# Patient Record
Sex: Male | Born: 1975 | Race: Black or African American | Hispanic: No | Marital: Single | Smoking: Current every day smoker
Health system: Southern US, Community
[De-identification: ages and names within clinical notes are randomized; demographics above are authoritative.]

## PROBLEM LIST (undated history)

## (undated) DIAGNOSIS — M25569 Pain in unspecified knee: Secondary | ICD-10-CM

## (undated) DIAGNOSIS — I10 Essential (primary) hypertension: Secondary | ICD-10-CM

## (undated) DIAGNOSIS — G8929 Other chronic pain: Secondary | ICD-10-CM

## (undated) HISTORY — PX: JOINT REPLACEMENT: SHX530

---

## 2013-02-17 ENCOUNTER — Encounter (HOSPITAL_COMMUNITY): Payer: Self-pay | Admitting: *Deleted

## 2013-02-17 ENCOUNTER — Emergency Department (HOSPITAL_COMMUNITY): Payer: Medicaid - Out of State

## 2013-02-17 ENCOUNTER — Emergency Department (HOSPITAL_COMMUNITY)
Admission: EM | Admit: 2013-02-17 | Discharge: 2013-02-17 | Disposition: A | Discharge status: Home / Self Care | Payer: Medicaid - Out of State | Attending: Emergency Medicine | Admitting: Emergency Medicine

## 2013-02-17 DIAGNOSIS — F17200 Nicotine dependence, unspecified, uncomplicated: Secondary | ICD-10-CM | POA: Insufficient documentation

## 2013-02-17 DIAGNOSIS — R059 Cough, unspecified: Secondary | ICD-10-CM | POA: Insufficient documentation

## 2013-02-17 DIAGNOSIS — M25569 Pain in unspecified knee: Secondary | ICD-10-CM | POA: Insufficient documentation

## 2013-02-17 DIAGNOSIS — G8929 Other chronic pain: Secondary | ICD-10-CM | POA: Insufficient documentation

## 2013-02-17 DIAGNOSIS — M545 Low back pain, unspecified: Secondary | ICD-10-CM | POA: Insufficient documentation

## 2013-02-17 HISTORY — DX: Pain in unspecified knee: M25.569

## 2013-02-17 HISTORY — DX: Other chronic pain: G89.29

## 2013-02-17 LAB — URINALYSIS, ROUTINE W REFLEX MICROSCOPIC
Glucose, UA: NEGATIVE mg/dL
Ketones, ur: NEGATIVE mg/dL
Leukocytes, UA: NEGATIVE
Protein, ur: NEGATIVE mg/dL
Urobilinogen, UA: 0.2 mg/dL (ref 0.0–1.0)

## 2013-02-17 LAB — COMPREHENSIVE METABOLIC PANEL
Albumin: 3.8 g/dL (ref 3.5–5.2)
Alkaline Phosphatase: 68 U/L (ref 39–117)
BUN: 8 mg/dL (ref 6–23)
CO2: 28 mEq/L (ref 19–32)
Chloride: 102 mEq/L (ref 96–112)
Creatinine, Ser: 1 mg/dL (ref 0.50–1.35)
GFR calc Af Amer: >90 mL/min (ref >90)
GFR calc non Af Amer: >90 mL/min (ref >90)
Glucose, Bld: 107 mg/dL — ABNORMAL HIGH (ref 70–99)
Potassium: 4.3 mEq/L (ref 3.5–5.1)
Total Bilirubin: 0.4 mg/dL (ref 0.3–1.2)

## 2013-02-17 LAB — CBC WITH DIFFERENTIAL/PLATELET
Basophils Relative: 1 % (ref 0–1)
HCT: 41.5 % (ref 39.0–52.0)
Hemoglobin: 14.7 g/dL (ref 13.0–17.0)
Lymphocytes Relative: 35 % (ref 12–46)
Lymphs Abs: 2.6 10*3/uL (ref 0.7–4.0)
MCHC: 35.4 g/dL (ref 30.0–36.0)
Monocytes Absolute: 0.8 10*3/uL (ref 0.1–1.0)
Monocytes Relative: 11 % (ref 3–12)
Neutro Abs: 3.7 10*3/uL (ref 1.7–7.7)
Neutrophils Relative %: 51 % (ref 43–77)
RBC: 4.64 MIL/uL (ref 4.22–5.81)

## 2013-02-17 MED ORDER — HYDROCODONE-ACETAMINOPHEN 5-325 MG PO TABS: ORAL_TABLET | ORAL | Status: DC

## 2013-02-17 MED ORDER — METHOCARBAMOL 500 MG PO TABS: 1000.0000 mg | ORAL_TABLET | Freq: Four times a day (QID) | ORAL | Status: DC | PRN

## 2013-02-17 MED ORDER — NAPROXEN 250 MG PO TABS: 250.0000 mg | ORAL_TABLET | Freq: Two times a day (BID) | ORAL | Status: DC

## 2013-02-17 NOTE — ED Provider Notes (Signed)
History     CSN: [id number]  Arrival date & time 02/17/13  1125   First MD Initiated Contact with Patient 02/17/13 1302      Chief Complaint  Patient presents with  . Back Pain  . rib pain   . Cough     HPI Pt was seen at 1330.   Per pt, c/o gradual onset and persistence of constant right sided low back "pain" that began 3 days ago. Pain worsens with palpation of the area and body position changes.  States he has had a persistent "cough" for the past several weeks, tx by his PMD in [place] with "some cough syrup that helps."  Denies fevers, no abd pain, no CP/SOB, no rash.  Denies incont/retention of bowel or bladder, no saddle anesthesia, no focal motor weakness, no tingling/numbness in extremities, no fevers, no injury, no abd pain, no testicular pain/swelling, no dysuria/hematuria.      Past Medical History  Diagnosis Date  . Chronic knee pain     Past Surgical History  Procedure Laterality Date  . Joint replacement      recent knee surgery    History  Substance Use Topics  . Smoking status: Current Some Day Smoker  . Smokeless tobacco: Not on file  . Alcohol Use: Yes     Comment: soc    Review of Systems ROS: Statement: All systems negative except as marked or noted in the HPI; Constitutional: Negative for fever and chills. ; ; Eyes: Negative for eye pain, redness and discharge. ; ; ENMT: Negative for ear pain, hoarseness, nasal congestion, sinus pressure and sore throat. ; ; Cardiovascular: Negative for chest pain, palpitations, diaphoresis, dyspnea and peripheral edema. ; ; Respiratory: +cough. Negative for wheezing and stridor. ; ; Gastrointestinal: Negative for nausea, vomiting, diarrhea, abdominal pain, blood in stool, hematemesis, jaundice and rectal bleeding. . ; ; Genitourinary: Negative for dysuria, flank pain and hematuria. ; ; Genital:  No penile drainage or rash, no testicular pain or swelling, no scrotal rash or swelling.;;  Musculoskeletal: +LBP. Negative for  neck pain. Negative for swelling and trauma.; ; Skin: Negative for pruritus, rash, abrasions, blisters, bruising and skin lesion.; ; Neuro: Negative for headache, lightheadedness and neck stiffness. Negative for weakness, altered level of consciousness , altered mental status, extremity weakness, paresthesias, involuntary movement, seizure and syncope.       Allergies  Amoxicillin  Home Medications   Current Outpatient Rx  Name  Route  Sig  Dispense  Refill  . promethazine-codeine (PHENERGAN WITH CODEINE) 6.25-10 MG/5ML syrup   Oral   Take 5 mLs by mouth every 4 (four) hours as needed for cough.           BP 161/87  Pulse 68  Temp(Src) 97 F (36.1 C) (Oral)  Resp 18  SpO2 97%  Physical Exam 1335: Physical examination:  Nursing notes reviewed; Vital signs and O2 SAT reviewed;  Constitutional: Well developed, Well nourished, Well hydrated, In no acute distress; Head:  Normocephalic, atraumatic; Eyes: EOMI, PERRL, No scleral icterus; ENMT: Mouth and pharynx normal, Mucous membranes moist; Neck: Supple, Full range of motion, No lymphadenopathy; Cardiovascular: Regular rate and rhythm, No murmur, rub, or gallop; Respiratory: Breath sounds clear & equal bilaterally, No rales, rhonchi, wheezes.  Speaking full sentences with ease, Normal respiratory effort/excursion; Chest: Nontender, Movement normal; Abdomen: Soft, Nontender, Nondistended, Normal bowel sounds; Genitourinary: No CVA tenderness; Spine:  No midline CS, TS, LS tenderness.  +TTP right lumbar paraspinal muscles;;  Extremities: Pulses normal,  No tenderness, No edema, No calf edema or asymmetry.; Neuro: AA&Ox3, Major CN grossly intact.  Speech clear. Climbs on and off stretcher by himself. Gait steady. No gross focal motor or sensory deficits in extremities.; Skin: Color normal, Warm, Dry.   ED Course  Procedures   MDM  MDM Reviewed: nursing note and vitals Interpretation: labs, x-ray and CT scan   Results for orders  placed during the hospital encounter of 02/17/13  CBC WITH DIFFERENTIAL      Result Value Range   WBC 7.3  4.0 - 10.5 K/uL   RBC 4.64  4.22 - 5.81 MIL/uL   Hemoglobin 14.7  13.0 - 17.0 g/dL   HCT 16.1  09.6 - 04.5 %   MCV 89.4  78.0 - 100.0 fL   MCH 31.7  26.0 - 34.0 pg   MCHC 35.4  30.0 - 36.0 g/dL   RDW 40.9  81.1 - 91.4 %   Platelets 240  150 - 400 K/uL   Neutrophils Relative 51  43 - 77 %   Neutro Abs 3.7  1.7 - 7.7 K/uL   Lymphocytes Relative 35  12 - 46 %   Lymphs Abs 2.6  0.7 - 4.0 K/uL   Monocytes Relative 11  3 - 12 %   Monocytes Absolute 0.8  0.1 - 1.0 K/uL   Eosinophils Relative 3  0 - 5 %   Eosinophils Absolute 0.2  0.0 - 0.7 K/uL   Basophils Relative 1  0 - 1 %   Basophils Absolute 0.1  0.0 - 0.1 K/uL  COMPREHENSIVE METABOLIC PANEL      Result Value Range   Sodium 138  135 - 145 mEq/L   Potassium 4.3  3.5 - 5.1 mEq/L   Chloride 102  96 - 112 mEq/L   CO2 28  19 - 32 mEq/L   Glucose, Bld 107 (*) 70 - 99 mg/dL   BUN 8  6 - 23 mg/dL   Creatinine, Ser 7.82  0.50 - 1.35 mg/dL   Calcium 9.5  8.4 - 95.6 mg/dL   Total Protein 7.8  6.0 - 8.3 g/dL   Albumin 3.8  3.5 - 5.2 g/dL   AST 21  0 - 37 U/L   ALT 17  0 - 53 U/L   Alkaline Phosphatase 68  39 - 117 U/L   Total Bilirubin 0.4  0.3 - 1.2 mg/dL   GFR calc non Af Amer >90  >90 mL/min   GFR calc Af Amer >90  >90 mL/min  URINALYSIS, ROUTINE W REFLEX MICROSCOPIC      Result Value Range   Color, Urine YELLOW  YELLOW   APPearance CLEAR  CLEAR   Specific Gravity, Urine 1.023  1.005 - 1.030   pH 5.5  5.0 - 8.0   Glucose, UA NEGATIVE  NEGATIVE mg/dL   Hgb urine dipstick NEGATIVE  NEGATIVE   Bilirubin Urine NEGATIVE  NEGATIVE   Ketones, ur NEGATIVE  NEGATIVE mg/dL   Protein, ur NEGATIVE  NEGATIVE mg/dL   Urobilinogen, UA 0.2  0.0 - 1.0 mg/dL   Nitrite NEGATIVE  NEGATIVE   Leukocytes, UA NEGATIVE  NEGATIVE   Ct Abdomen Pelvis Wo Contrast 02/17/2013  *RADIOLOGY REPORT*  Clinical Data: Right flank pain  CT ABDOMEN AND  PELVIS WITHOUT CONTRAST  Technique:  Multidetector CT imaging of the abdomen and pelvis was performed following the standard protocol without intravenous contrast.  Comparison: None.  Findings: Lung bases are clear.  No pleural or pericardial fluid.  The liver appears normal without contrast.  No calcified gallstones.  The spleen is normal.  The pancreas is normal.  The adrenal glands are normal.  The kidneys are normal without evidence of cyst, mass, stone or hydronephrosis.  No evidence of passing stone.  The aorta and IVC are normal.  No retroperitoneal mass or adenopathy.  No free intraperitoneal fluid or air.  There is a moderate amount of fecal matter in the colon.  The appendix appears normal.  Bladder, prostate gland and seminal vesicles are unremarkable.  No stone in the bladder. No bony finding.  IMPRESSION: Normal examination.  No lesions seen to explain flank pain.   Original Report Authenticated By: Paulina Fusi, M.D.    Dg Chest 2 View 02/17/2013  *RADIOLOGY REPORT*  Clinical Data: Back pain and cough  CHEST - 2 VIEW  Comparison: None  Findings: The heart size and mediastinal contours are within normal limits.  Both lungs are clear.  The visualized skeletal structures are unremarkable.  IMPRESSION: Negative examination.   Original Report Authenticated By: Signa Kell, M.D.     1600:  No acute findings on workup to account for right lower back pain.  Will tx symptomatically for now. Wants to go home now. Dx and testing d/w pt. Questions answered.  Verb understanding, agreeable to d/c home with outpt f/u.          Laray Anger, DO 02/19/13 1137

## 2013-02-17 NOTE — ED Notes (Signed)
Pt is here with rib pain and lower back pain and reports recent cough.  Pt reports thinks it is pain in his kidney

## 2013-02-18 LAB — URINE CULTURE
Colony Count: NO GROWTH
Culture: NO GROWTH

## 2014-07-26 ENCOUNTER — Emergency Department (HOSPITAL_COMMUNITY)
Admission: EM | Admit: 2014-07-26 | Discharge: 2014-07-26 | Disposition: A | Discharge status: Home / Self Care | Payer: Medicaid - Out of State | Attending: Emergency Medicine | Admitting: Emergency Medicine

## 2014-07-26 ENCOUNTER — Encounter (HOSPITAL_COMMUNITY): Payer: Self-pay | Admitting: Emergency Medicine

## 2014-07-26 DIAGNOSIS — G8929 Other chronic pain: Secondary | ICD-10-CM | POA: Diagnosis not present

## 2014-07-26 DIAGNOSIS — Z880 Allergy status to penicillin: Secondary | ICD-10-CM | POA: Diagnosis not present

## 2014-07-26 DIAGNOSIS — IMO0002 Reserved for concepts with insufficient information to code with codable children: Secondary | ICD-10-CM | POA: Diagnosis not present

## 2014-07-26 DIAGNOSIS — Y9289 Other specified places as the place of occurrence of the external cause: Secondary | ICD-10-CM | POA: Diagnosis not present

## 2014-07-26 DIAGNOSIS — S060X0A Concussion without loss of consciousness, initial encounter: Secondary | ICD-10-CM | POA: Insufficient documentation

## 2014-07-26 DIAGNOSIS — Y9389 Activity, other specified: Secondary | ICD-10-CM | POA: Insufficient documentation

## 2014-07-26 DIAGNOSIS — R42 Dizziness and giddiness: Secondary | ICD-10-CM

## 2014-07-26 DIAGNOSIS — S0990XA Unspecified injury of head, initial encounter: Secondary | ICD-10-CM | POA: Insufficient documentation

## 2014-07-26 DIAGNOSIS — F17200 Nicotine dependence, unspecified, uncomplicated: Secondary | ICD-10-CM | POA: Diagnosis not present

## 2014-07-26 LAB — CBC WITH DIFFERENTIAL/PLATELET
BASOS ABS: 0 10*3/uL (ref 0.0–0.1)
BASOS PCT: 0 % (ref 0–1)
EOS ABS: 0 10*3/uL (ref 0.0–0.7)
EOS PCT: 0 % (ref 0–5)
HEMATOCRIT: 40.7 % (ref 39.0–52.0)
Hemoglobin: 14.2 g/dL (ref 13.0–17.0)
Lymphocytes Relative: 23 % (ref 12–46)
Lymphs Abs: 1.6 10*3/uL (ref 0.7–4.0)
MCH: 32.3 pg (ref 26.0–34.0)
MCHC: 34.9 g/dL (ref 30.0–36.0)
MCV: 92.7 fL (ref 78.0–100.0)
MONO ABS: 0.6 10*3/uL (ref 0.1–1.0)
Monocytes Relative: 8 % (ref 3–12)
NEUTROS ABS: 4.9 10*3/uL (ref 1.7–7.7)
Neutrophils Relative %: 69 % (ref 43–77)
Platelets: 253 10*3/uL (ref 150–400)
RBC: 4.39 MIL/uL (ref 4.22–5.81)
RDW: 12.9 % (ref 11.5–15.5)
WBC: 7 10*3/uL (ref 4.0–10.5)

## 2014-07-26 LAB — BASIC METABOLIC PANEL
ANION GAP: 14 (ref 5–15)
BUN: 10 mg/dL (ref 6–23)
CALCIUM: 8.8 mg/dL (ref 8.4–10.5)
CHLORIDE: 102 meq/L (ref 96–112)
CO2: 23 mEq/L (ref 19–32)
CREATININE: 0.98 mg/dL (ref 0.50–1.35)
Glucose, Bld: 89 mg/dL (ref 70–99)
Potassium: 3.9 mEq/L (ref 3.7–5.3)
Sodium: 139 mEq/L (ref 137–147)

## 2014-07-26 MED ORDER — MECLIZINE HCL 25 MG PO TABS
50.0000 mg | ORAL_TABLET | Freq: Once | ORAL | Status: AC
  Administered 2014-07-26: 50 mg via ORAL
  Filled 2014-07-26: qty 2

## 2014-07-26 MED ORDER — MECLIZINE HCL 50 MG PO TABS: 50.0000 mg | ORAL_TABLET | Freq: Three times a day (TID) | ORAL | Status: AC | PRN

## 2014-07-26 NOTE — ED Notes (Addendum)
Pt to ED c/o of dizziness and headache that started this morning around 0600. Pt denies chest pain or blurred vision. Pt stated that he hit his head coming down the stairs yesterday evening around 1800. Pt is nauseated, vomited twice this morning. Pt had cold symptoms since last Friday for which he took Niquil. Pt took 2 baby asprin for headache. no neuro deficit noted

## 2014-07-26 NOTE — ED Notes (Signed)
Dr. Wofford at bedside 

## 2014-07-26 NOTE — ED Provider Notes (Signed)
CSN: [id number]     Arrival date & time 07/26/14  0818 History   First MD Initiated Contact with Patient 07/26/14 0840     Chief Complaint  Patient presents with  . Dizziness  . Headache   Reginald Bautista is a 38 year-old AAM who presents today with headache. Patient endorses he's had a sinus headache for approximately a week with a little bit of a cough he's been taking NyQuil as needed for this. Over the past week his symptoms have resolved. However yesterday while helping a friend move he was carrying boxes down some steps when he hit the front of his head on the stairs. Patient says he didn't think anything of it however this morning when he woke up he had a headache and felt dizzy. He describes the dizziness as an imbalance and not as a spinning. Patient endorses up yesterday evening he to hydrocodone for his left knee pain that he's had after an injury 2 years ago. He also had a couple of beers and took NyQuil. HA resolved w/ASA.  He denies CP, SOB, fever, chills, constipation, hematemesis, dysuria, hematuria, or recent travel.   (Consider location/radiation/quality/duration/timing/severity/associated sxs/prior Treatment) Patient is a 38 y.o. male presenting with dizziness.  Dizziness Quality:  Imbalance Severity:  Moderate Onset quality:  Sudden Timing:  Intermittent Progression:  Waxing and waning Chronicity:  New Context: head movement and standing up   Context: not with ear pain   Associated symptoms: headaches   Associated symptoms: no chest pain, no diarrhea, no nausea, no palpitations, no shortness of breath, no syncope, no tinnitus and no vomiting   Risk factors: no new medications     Past Medical History  Diagnosis Date  . Chronic knee pain    Past Surgical History  Procedure Laterality Date  . Joint replacement      recent knee surgery   History reviewed. No pertinent family history. History  Substance Use Topics  . Smoking status: Current Some Day Smoker  .  Smokeless tobacco: Not on file  . Alcohol Use: Yes     Comment: soc    Review of Systems  Unable to perform ROS Constitutional: Negative for fever and chills.  HENT: Negative for tinnitus.   Respiratory: Negative for shortness of breath.   Cardiovascular: Negative for chest pain, palpitations, leg swelling and syncope.  Gastrointestinal: Negative for nausea, vomiting, abdominal pain, diarrhea, constipation and abdominal distention.  Genitourinary: Negative for dysuria, frequency, flank pain and decreased urine volume.  Neurological: Positive for dizziness and headaches. Negative for syncope, speech difficulty, weakness, light-headedness and numbness.  All other systems reviewed and are negative.     Allergies  Amoxicillin  Home Medications   Prior to Admission medications   Medication Sig Start Date End Date Taking? Authorizing Provider  aspirin EC 81 MG tablet Take 162 mg by mouth once.   Yes Historical Provider, MD  DM-Doxylamine-Acetaminophen (NYQUIL COLD & FLU PO) Take 2 capsules by mouth daily as needed (cold symptoms).   Yes Historical Provider, MD  HYDROcodone-acetaminophen (NORCO/VICODIN) 5-325 MG per tablet Take 1 tablet by mouth daily as needed for moderate pain.   Yes Historical Provider, MD  meclizine (ANTIVERT) 50 MG tablet Take 1 tablet (50 mg total) by mouth 3 (three) times daily as needed. 07/26/14   Rachelle HoraKeri Taft Worthing, MD   BP 151/81  Pulse 72  Temp(Src) 98.7 F (37.1 C) (Oral)  Resp 18  Ht 5\' 9"  (1.753 m)  Wt 205 lb (92.987 kg)  BMI  30.26 kg/m2  SpO2 98% Physical Exam  Nursing note and vitals reviewed. Constitutional: He appears well-developed and well-nourished. No distress.  HENT:  Head: Normocephalic and atraumatic.  Right Ear: External ear normal.  Left Ear: External ear normal.  MM dry. Right TM blocked by cerumen.  Eyes: Pupils are equal, round, and reactive to light.  Neck: Normal range of motion.  Cardiovascular: Normal rate, regular rhythm, normal  heart sounds and intact distal pulses.  Exam reveals no gallop and no friction rub.   No murmur heard. Pulmonary/Chest: Effort normal and breath sounds normal. No respiratory distress. He has no wheezes. He has no rales. He exhibits no tenderness.  Abdominal: Soft. Bowel sounds are normal. He exhibits no distension and no mass. There is no tenderness. There is no rebound and no guarding.  Musculoskeletal: Normal range of motion.  Lymphadenopathy:    He has no cervical adenopathy.  Skin: Skin is warm and dry. He is not diaphoretic.    ED Course  Procedures (including critical care time) Labs Review Labs Reviewed  CBC WITH DIFFERENTIAL  BASIC METABOLIC PANEL    Imaging Review No results found.   EKG Interpretation   Date/Time:  Tuesday July 26 2014 08:31:05 EDT Ventricular Rate:  77 PR Interval:  223 QRS Duration: 88 QT Interval:  388 QTC Calculation: 439 R Axis:   69 Text Interpretation:  Sinus rhythm Prolonged PR interval ST elev, probable  normal early repol pattern No old tracing to compare Confirmed by [facility]   MD, TREY ([id number]) on 07/26/2014 10:00:57 AM      MDM   38 year old American male who presents today with dizziness and headache. He sees  Details. On exam patient in NAD, AF VS SS-5 hypertension. Physical exam very benign with no focal neural deficits. Patient does have slight nystagmus when looking to the left which resolved her fatigue. He has a little bit of neck tenderness but not over the spine, located over paraspinal muscles. He has full range of motion of the neck. Nystagmus to the left that fatigues. TM on the right has cerumen blocking left TM clear. Pharynx clear with no injection or erythema. Tongue slightly dry. Remainder of exam completely benign.  Suspects concussive syndrome with possible vertigo atop this. However will check CBC BMP and EKG. EKG shows NSR with first degree block with PR equal to 223. No signs of arrhythmia including Brugada or WPW.  No signs of ischemia.  CBC BMP within normal limits no signs of anemia her left eye abnormalities. Different this time patient is stable for discharge. Be given concussion precautions and note for work and will give meclizine increase her encasement there is a vertigo component to this. Surgical precautions include worsening headache of life or focal neural deficits.  Final diagnoses:  Concussion, without loss of consciousness, initial encounter  Dizzy    Pt was seen under the supervision of Dr. Hendricks Milo.     Rachelle Hora, MD 07/26/14 1004

## 2014-07-26 NOTE — Discharge Instructions (Signed)
Concussion A concussion, or closed-head injury, is a brain injury caused by a direct blow to the head or by a quick and sudden movement (jolt) of the head or neck. Concussions are usually not life-threatening. Even so, the effects of a concussion can be serious. If you have had a concussion before, you are more likely to experience concussion-like symptoms after a direct blow to the head.  CAUSES  Direct blow to the head, such as from running into another player during a soccer game, being hit in a fight, or hitting your head on a hard surface.  A jolt of the head or neck that causes the brain to move back and forth inside the skull, such as in a car crash. SIGNS AND SYMPTOMS The signs of a concussion can be hard to notice. Early on, they may be missed by you, family members, and health care providers. You may look fine but act or feel differently. Symptoms are usually temporary, but they may last for days, weeks, or even longer. Some symptoms may appear right away while others may not show up for hours or days. Every head injury is different. Symptoms include:  Mild to moderate headaches that will not go away.  A feeling of pressure inside your head.  Having more trouble than usual:  Learning or remembering things you have heard.  Answering questions.  Paying attention or concentrating.  Organizing daily tasks.  Making decisions and solving problems.  Slowness in thinking, acting or reacting, speaking, or reading.  Getting lost or being easily confused.  Feeling tired all the time or lacking energy (fatigued).  Feeling drowsy.  Sleep disturbances.  Sleeping more than usual.  Sleeping less than usual.  Trouble falling asleep.  Trouble sleeping (insomnia).  Loss of balance or feeling lightheaded or dizzy.  Nausea or vomiting.  Numbness or tingling.  Increased sensitivity to:  Sounds.  Lights.  Distractions.  Vision problems or eyes that tire  easily.  Diminished sense of taste or smell.  Ringing in the ears.  Mood changes such as feeling sad or anxious.  Becoming easily irritated or angry for little or no reason.  Lack of motivation.  Seeing or hearing things other people do not see or hear (hallucinations). DIAGNOSIS Your health care provider can usually diagnose a concussion based on a description of your injury and symptoms. He or she will ask whether you passed out (lost consciousness) and whether you are having trouble remembering events that happened right before and during your injury. Your evaluation might include:  A brain scan to look for signs of injury to the brain. Even if the test shows no injury, you may still have a concussion.  Blood tests to be sure other problems are not present. TREATMENT  Concussions are usually treated in an emergency department, in urgent care, or at a clinic. You may need to stay in the hospital overnight for further treatment.  Tell your health care provider if you are taking any medicines, including prescription medicines, over-the-counter medicines, and natural remedies. Some medicines, such as blood thinners (anticoagulants) and aspirin, may increase the chance of complications. Also tell your health care provider whether you have had alcohol or are taking illegal drugs. This information may affect treatment.  Your health care provider will send you home with important instructions to follow.  How fast you will recover from a concussion depends on many factors. These factors include how severe your concussion is, what part of your brain was injured, your  age, and how healthy you were before the concussion. °· Most people with mild injuries recover fully. Recovery can take time. In general, recovery is slower in older persons. Also, persons who have had a concussion in the past or have other medical problems may find that it takes longer to recover from their current injury. °HOME  CARE INSTRUCTIONS °General Instructions °· Carefully follow the directions your health care provider gave you. °· Only take over-the-counter or prescription medicines for pain, discomfort, or fever as directed by your health care provider. °· Take only those medicines that your health care provider has approved. °· Do not drink alcohol until your health care provider says you are well enough to do so. Alcohol and certain other drugs may slow your recovery and can put you at risk of further injury. °· If it is harder than usual to remember things, write them down. °· If you are easily distracted, try to do one thing at a time. For example, do not try to watch TV while fixing dinner. °· Talk with family members or close friends when making important decisions. °· Keep all follow-up appointments. Repeated evaluation of your symptoms is recommended for your recovery. °· Watch your symptoms and tell others to do the same. Complications sometimes occur after a concussion. Older adults with a brain injury may have a higher risk of serious complications, such as a blood clot on the brain. °· Tell your teachers, school nurse, school counselor, coach, athletic trainer, or work manager about your injury, symptoms, and restrictions. Tell them about what you can or cannot do. They should watch for: °¨ Increased problems with attention or concentration. °¨ Increased difficulty remembering or learning new information. °¨ Increased time needed to complete tasks or assignments. °¨ Increased irritability or decreased ability to cope with stress. °¨ Increased symptoms. °· Rest. Rest helps the brain to heal. Make sure you: °¨ Get plenty of sleep at night. Avoid staying up late at night. °¨ Keep the same bedtime hours on weekends and weekdays. °¨ Rest during the day. Take daytime naps or rest breaks when you feel tired. °· Limit activities that require a lot of thought or concentration. These include: °¨ Doing homework or job-related  work. °¨ Watching TV. °¨ Working on the computer. °· Avoid any situation where there is potential for another head injury (football, hockey, soccer, basketball, martial arts, downhill snow sports and horseback riding). Your condition will get worse every time you experience a concussion. You should avoid these activities until you are evaluated by the appropriate follow-up health care providers. °Returning To Your Regular Activities °You will need to return to your normal activities slowly, not all at once. You must give your body and brain enough time for recovery. °· Do not return to sports or other athletic activities until your health care provider tells you it is safe to do so. °· Ask your health care provider when you can drive, ride a bicycle, or operate heavy machinery. Your ability to react may be slower after a brain injury. Never do these activities if you are dizzy. °· Ask your health care provider about when you can return to work or school. °Preventing Another Concussion °It is very important to avoid another brain injury, especially before you have recovered. In rare cases, another injury can lead to permanent brain damage, brain swelling, or death. The risk of this is greatest during the first 7-10 days after a head injury. Avoid injuries by: °· Wearing a seat   belt when riding in a car.  Drinking alcohol only in moderation.  Wearing a helmet when biking, skiing, skateboarding, skating, or doing similar activities.  Avoiding activities that could lead to a second concussion, such as contact or recreational sports, until your health care provider says it is okay.  Taking safety measures in your home.  Remove clutter and tripping hazards from floors and stairways.  Use grab bars in bathrooms and handrails by stairs.  Place non-slip mats on floors and in bathtubs.  Improve lighting in dim areas. SEEK MEDICAL CARE IF:  You have increased problems paying attention or  concentrating.  You have increased difficulty remembering or learning new information.  You need more time to complete tasks or assignments than before.  You have increased irritability or decreased ability to cope with stress.  You have more symptoms than before. Seek medical care if you have any of the following symptoms for more than 2 weeks after your injury:  Lasting (chronic) headaches.  Dizziness or balance problems.  Nausea.  Vision problems.  Increased sensitivity to noise or light.  Depression or mood swings.  Anxiety or irritability.  Memory problems.  Difficulty concentrating or paying attention.  Sleep problems.  Feeling tired all the time. SEEK IMMEDIATE MEDICAL CARE IF:  You have severe or worsening headaches. These may be a sign of a blood clot in the brain.  You have weakness (even if only in one hand, leg, or part of the face).  You have numbness.  You have decreased coordination.  You vomit repeatedly.  You have increased sleepiness.  One pupil is larger than the other.  You have convulsions.  You have slurred speech.  You have increased confusion. This may be a sign of a blood clot in the brain.  You have increased restlessness, agitation, or irritability.  You are unable to recognize people or places.  You have neck pain.  It is difficult to wake you up.  You have unusual behavior changes.  You lose consciousness. MAKE SURE YOU:  Understand these instructions.  Will watch your condition.  Will get help right away if you are not doing well or get worse. Document Released: 02/29/2004 Document Revised: 12/14/2013 Document Reviewed: 07/01/2013 [facility] Patient Information 2015 [place], [place]. This information is not intended to replace advice given to you by your health care provider. Make sure you discuss any questions you have with your health care provider.   Vertigo Vertigo means you feel like you or your surroundings  are moving when they are not. Vertigo can be dangerous if it occurs when you are at work, driving, or performing difficult activities.  CAUSES  Vertigo occurs when there is a conflict of signals sent to your brain from the visual and sensory systems in your body. There are many different causes of vertigo, including:  Infections, especially in the inner ear.  A bad reaction to a drug or misuse of alcohol and medicines.  Withdrawal from drugs or alcohol.  Rapidly changing positions, such as lying down or rolling over in bed.  A migraine headache.  Decreased blood flow to the brain.  Increased pressure in the brain from a head injury, infection, tumor, or bleeding. SYMPTOMS  You may feel as though the world is spinning around or you are falling to the ground. Because your balance is upset, vertigo can cause nausea and vomiting. You may have involuntary eye movements (nystagmus). DIAGNOSIS  Vertigo is usually diagnosed by physical exam. If the cause of  your vertigo is unknown, your caregiver may perform imaging tests, such as an MRI scan (magnetic resonance imaging). TREATMENT  Most cases of vertigo resolve on their own, without treatment. Depending on the cause, your caregiver may prescribe certain medicines. If your vertigo is related to body position issues, your caregiver may recommend movements or procedures to correct the problem. In rare cases, if your vertigo is caused by certain inner ear problems, you may need surgery. HOME CARE INSTRUCTIONS   Follow your caregiver's instructions.  Avoid driving.  Avoid operating heavy machinery.  Avoid performing any tasks that would be dangerous to you or others during a vertigo episode.  Tell your caregiver if you notice that certain medicines seem to be causing your vertigo. Some of the medicines used to treat vertigo episodes can actually make them worse in some people. SEEK IMMEDIATE MEDICAL CARE IF:   Your medicines do not relieve  your vertigo or are making it worse.  You develop problems with talking, walking, weakness, or using your arms, hands, or legs.  You develop severe headaches.  Your nausea or vomiting continues or gets worse.  You develop visual changes.  A family member notices behavioral changes.  Your condition gets worse. MAKE SURE YOU:  Understand these instructions.  Will watch your condition.  Will get help right away if you are not doing well or get worse. Document Released: 09/18/2005 Document Revised: 03/02/2012 Document Reviewed: 06/27/2011 [facility] Patient Information 2015 [place]. This information is not intended to replace advice given to you by your health care provider. Make sure you discuss any questions you have with your health care provider.

## 2014-07-26 NOTE — ED Provider Notes (Signed)
I saw and evaluated the patient, reviewed the resident's note and I agree with the findings and plan.   EKG Interpretation   Date/Time:  Tuesday July 26 2014 08:31:05 EDT Ventricular Rate:  77 PR Interval:  223 QRS Duration: 88 QT Interval:  388 QTC Calculation: 439 R Axis:   69 Text Interpretation:  Sinus rhythm Prolonged PR interval ST elev, probable  normal early repol pattern No old tracing to compare [facility]   MD, TREY ([id number]) on 07/26/2014 10:00:57 AM        Reginald ChurnJohn David Eveline Sauve III, MD 07/26/14 1040
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
## 2015-02-27 ENCOUNTER — Emergency Department (HOSPITAL_COMMUNITY): Payer: Medicaid - Out of State

## 2015-02-27 ENCOUNTER — Emergency Department (HOSPITAL_COMMUNITY)
Admission: EM | Admit: 2015-02-27 | Discharge: 2015-02-27 | Disposition: A | Discharge status: Home / Self Care | Payer: Medicaid - Out of State | Attending: Emergency Medicine | Admitting: Emergency Medicine

## 2015-02-27 ENCOUNTER — Encounter (HOSPITAL_COMMUNITY): Payer: Self-pay

## 2015-02-27 DIAGNOSIS — Z880 Allergy status to penicillin: Secondary | ICD-10-CM | POA: Diagnosis not present

## 2015-02-27 DIAGNOSIS — Z720 Tobacco use: Secondary | ICD-10-CM | POA: Diagnosis not present

## 2015-02-27 DIAGNOSIS — R05 Cough: Secondary | ICD-10-CM | POA: Diagnosis present

## 2015-02-27 DIAGNOSIS — J069 Acute upper respiratory infection, unspecified: Secondary | ICD-10-CM | POA: Diagnosis not present

## 2015-02-27 DIAGNOSIS — G8929 Other chronic pain: Secondary | ICD-10-CM | POA: Diagnosis not present

## 2015-02-27 DIAGNOSIS — Z79899 Other long term (current) drug therapy: Secondary | ICD-10-CM | POA: Diagnosis not present

## 2015-02-27 MED ORDER — ALBUTEROL SULFATE (2.5 MG/3ML) 0.083% IN NEBU
5.0000 mg | INHALATION_SOLUTION | Freq: Once | RESPIRATORY_TRACT | Status: AC
  Administered 2015-02-27: 5 mg via RESPIRATORY_TRACT
  Filled 2015-02-27: qty 6

## 2015-02-27 MED ORDER — HYDROCODONE-ACETAMINOPHEN 5-325 MG PO TABS: 1.0000 | ORAL_TABLET | ORAL | Status: AC | PRN

## 2015-02-27 MED ORDER — DEXAMETHASONE SODIUM PHOSPHATE 10 MG/ML IJ SOLN
10.0000 mg | Freq: Once | INTRAMUSCULAR | Status: AC
  Administered 2015-02-27: 10 mg via INTRAMUSCULAR
  Filled 2015-02-27: qty 1

## 2015-02-27 MED ORDER — ALBUTEROL SULFATE HFA 108 (90 BASE) MCG/ACT IN AERS
2.0000 | INHALATION_SPRAY | Freq: Once | RESPIRATORY_TRACT | Status: AC
  Administered 2015-02-27: 2 via RESPIRATORY_TRACT
  Filled 2015-02-27: qty 6.7

## 2015-02-27 MED ORDER — SODIUM CHLORIDE 0.9 % IV BOLUS (SEPSIS)
1000.0000 mL | Freq: Once | INTRAVENOUS | Status: AC
  Administered 2015-02-27: 1000 mL via INTRAVENOUS

## 2015-02-27 MED ORDER — ALBUTEROL SULFATE HFA 108 (90 BASE) MCG/ACT IN AERS: 2.0000 | INHALATION_SPRAY | Freq: Once | RESPIRATORY_TRACT | Status: DC

## 2015-02-27 MED ORDER — ALBUTEROL SULFATE HFA 108 (90 BASE) MCG/ACT IN AERS: 2.0000 | INHALATION_SPRAY | Freq: Four times a day (QID) | RESPIRATORY_TRACT | Status: AC | PRN

## 2015-02-27 MED ORDER — ALBUTEROL (5 MG/ML) CONTINUOUS INHALATION SOLN: 10.0000 mg/h | INHALATION_SOLUTION | RESPIRATORY_TRACT | Status: DC

## 2015-02-27 NOTE — ED Notes (Addendum)
Pt. Reports cold symptoms x5 days, taken ibuprofen and benadryl with no relief. Reports productive cough. States woke up this AM with dizziness with blurred vision, states he feels like the room is spinning. Alert and oriented x4 in triage.

## 2015-02-28 NOTE — ED Provider Notes (Signed)
CSN: [id number]     Arrival date & time 02/27/15  1422 History   First MD Initiated Contact with Patient 02/27/15 1627     Chief Complaint  Patient presents with  . Cough     (Consider location/radiation/quality/duration/timing/severity/associated sxs/prior Treatment) Patient is a 39 y.o. male presenting with cough and URI. The history is provided by the patient.  Cough Cough characteristics:  Non-productive Severity:  Moderate Onset quality:  Gradual Duration:  5 days Timing:  Constant Progression:  Worsening Chronicity:  New Smoker: no   Context comment:  No known sick contacts Relieved by:  Nothing Worsened by:  Activity Ineffective treatments: Ibuprofen and Benadryl. Associated symptoms: chills, myalgias, rhinorrhea, sore throat and wheezing   Associated symptoms: no chest pain, no diaphoresis, no ear pain, no fever, no headaches, no rash and no shortness of breath   URI Presenting symptoms: congestion, cough, fatigue, rhinorrhea and sore throat   Presenting symptoms: no ear pain and no fever   Severity:  Moderate Onset quality:  Gradual Duration:  5 days Timing:  Constant Progression:  Worsening Chronicity:  New Relieved by:  Nothing Worsened by:  Nothing tried Ineffective treatments: Ibuprofen and Benadryl. Associated symptoms: myalgias and wheezing   Associated symptoms: no headaches and no neck pain   Risk factors: no chronic respiratory disease, no recent illness, no recent travel and no sick contacts      Past Medical History  Diagnosis Date  . Chronic knee pain    Past Surgical History  Procedure Laterality Date  . Joint replacement      recent knee surgery   No family history on file. History  Substance Use Topics  . Smoking status: Current Some Day Smoker  . Smokeless tobacco: Not on file  . Alcohol Use: Yes     Comment: soc    Review of Systems  Constitutional: Positive for chills and fatigue. Negative for fever and diaphoresis.  HENT:  Positive for congestion, rhinorrhea and sore throat. Negative for ear pain, trouble swallowing and voice change.   Eyes: Negative for visual disturbance.  Respiratory: Positive for cough and wheezing. Negative for shortness of breath.   Cardiovascular: Negative for chest pain and leg swelling.  Gastrointestinal: Negative for nausea, vomiting, abdominal pain and diarrhea.  Genitourinary: Negative for flank pain.  Musculoskeletal: Positive for myalgias. Negative for neck pain.  Skin: Negative for rash.  Allergic/Immunologic: Negative for immunocompromised state.  Neurological: Negative for dizziness, weakness, light-headedness and headaches.  All other systems reviewed and are negative.     Allergies  Amoxicillin  Home Medications   Prior to Admission medications   Medication Sig Start Date End Date Taking? Authorizing Provider  diphenhydrAMINE (SOMINEX) 25 MG tablet Take 25 mg by mouth at bedtime as needed for sleep.   Yes Historical Provider, MD  ibuprofen (ADVIL,MOTRIN) 400 MG tablet Take 400 mg by mouth every 6 (six) hours as needed for moderate pain.   Yes Historical Provider, MD  albuterol (PROVENTIL HFA;VENTOLIN HFA) 108 (90 BASE) MCG/ACT inhaler Inhale 2 puffs into the lungs every 6 (six) hours as needed for wheezing or shortness of breath. 02/27/15   Shaune Pollack, MD  HYDROcodone-acetaminophen (NORCO/VICODIN) 5-325 MG per tablet Take 1-2 tablets by mouth every 4 (four) hours as needed (Cough). 02/27/15   Shaune Pollack, MD  meclizine (ANTIVERT) 50 MG tablet Take 1 tablet (50 mg total) by mouth 3 (three) times daily as needed. 07/26/14   Rachelle Hora, MD   BP 141/77 mmHg  Pulse 63  Temp(Src) 98 F (36.7 C) (Oral)  Resp 16  Ht  (1.753 m)  Wt 220 lb (99.791 kg)  BMI 32.47 kg/m2  SpO2 100% Physical Exam  Constitutional: He is oriented to person, place, and time. He appears well-developed and well-nourished. No distress.  HENT:  Head: Normocephalic and atraumatic.  Mild  nasal mucosal edema bilaterally with clear discharge. Mild posterior pharyngeal erythema but with no tonsillar swelling or exudates. Uvula is midline. No peritonsillar swelling or asymmetry.  Eyes: Conjunctivae are normal. Pupils are equal, round, and reactive to light.  Neck: Normal range of motion. Neck supple.  Painless, full range of motion. No stridor.  Cardiovascular: Normal rate, normal heart sounds and intact distal pulses.  Exam reveals no friction rub.   No murmur heard. Pulmonary/Chest: Effort normal. No respiratory distress. He has wheezes (Scant, end expiratory). He has no rales.  Abdominal: Soft. Bowel sounds are normal. He exhibits no distension. There is no tenderness.  Musculoskeletal: He exhibits no edema.  Neurological: He is alert and oriented to person, place, and time.  Skin: Skin is warm. No rash noted.  Nursing note and vitals reviewed.   ED Course  Procedures (including critical care time) Labs Review Labs Reviewed - No data to display  Imaging Review Dg Chest 2 View  02/27/2015   CLINICAL DATA:  Productive cough, congestion, fever and shortness of breath for 5 days.  EXAM: CHEST  2 VIEW  COMPARISON:  February 17, 2013  FINDINGS: The heart size and mediastinal contours are within normal limits. Both lungs are clear. The visualized skeletal structures are unremarkable.  IMPRESSION: No active cardiopulmonary disease.   Electronically Signed   By: Sherian Rein M.D.   On: 02/27/2015 15:34     EKG Interpretation   Date/Time:  Monday February 27 2015 17:14:54 EST Ventricular Rate:  52 PR Interval:  196 QRS Duration: 83 QT Interval:  420 QTC Calculation: 390 R Axis:   57 Text Interpretation:  Sinus bradycardia Anteroseptal infarct, old ST  elevation,  Early repolarization When compared with ECG of 07/26/2014, HEART  RATE has decreased Confirmed by [facility]  MD, DAVID ([id number]) on 02/27/2015  5:19:42 PM      MDM   39 yo M with no significant PMHx who p/w a 5-day  history of cough, nasal congestion, sore throat, and general malaise with no documented fevers. See HPI above. On arrival, T 98.49F, HR 69, RR 18, BP 158/96, satting 99% on RA. Exam as above, pt overall well-appearing, non-toxic, with faint, diffuse end expiratory wheezes.  Pt's presentation is most c/w acute viral URI with wheezing. CXR clear, pt afebrile without signs of focal bacterial PNA. He does have mild sore throat but no evidence of tonsillitis, PTA, RPA on exam or history. Pt is o/w well-appearing, with no h/o asthma or underlying lung disease. Will give albuterol trial and re-assess. No CP, palpitations, or s/s myocarditis or pericarditis. Pt non-toxic, tolerating PO without evidence of sepsis. PERC negative, do not suspect PE etiology for wheezing and pt has concomitant URI sx, making this more likely.  Pt has moderate improvement with albuterol. Will give decadron IM, d/c with albuterol MDI and hydrocodone PRN cough. Pt in agreement. He will f/u with his PCP.  Clinical Impression: 1. URI (upper respiratory infection)     Disposition: Discharge  Condition: Good  I have discussed the results, Dx and Tx plan with the pt(& family if present). He/she/they expressed understanding and agree(s) with the plan. Discharge instructions discussed  at great length. Strict return precautions discussed and pt &/or family have verbalized understanding of the instructions. No further questions at time of discharge.    Discharge Medication List as of 02/27/2015  7:29 PM    START taking these medications   Details  albuterol (PROVENTIL HFA;VENTOLIN HFA) 108 (90 BASE) MCG/ACT inhaler Inhale 2 puffs into the lungs every 6 (six) hours as needed for wheezing or shortness of breath., Starting 02/27/2015, Until Discontinued, Print    HYDROcodone-acetaminophen (NORCO/VICODIN) 5-325 MG per tablet Take 1-2 tablets by mouth every 4 (four) hours as needed (Cough)., Starting 02/27/2015, Until Discontinued, Print         [facility]: [facility]     [address] [place] [phone]  Follow-up with your PCP in 3-5 days. If you do not have a PCP, call this number to set up an appointment.   Pt seen in conjunction with Dr. Regina EckGlick     Clennon Nasca, MD 02/28/15 [phone]  Dione Boozeavid Glick, MD 02/28/15 1500

## 2016-10-18 ENCOUNTER — Emergency Department (HOSPITAL_COMMUNITY): Payer: Medicaid - Out of State

## 2016-10-18 ENCOUNTER — Encounter (HOSPITAL_COMMUNITY): Payer: Self-pay | Admitting: Emergency Medicine

## 2016-10-18 ENCOUNTER — Emergency Department (HOSPITAL_COMMUNITY)
Admission: EM | Admit: 2016-10-18 | Discharge: 2016-10-18 | Disposition: A | Discharge status: Home / Self Care | Payer: Medicaid - Out of State | Attending: Emergency Medicine | Admitting: Emergency Medicine

## 2016-10-18 ENCOUNTER — Encounter (HOSPITAL_COMMUNITY): Payer: Self-pay

## 2016-10-18 DIAGNOSIS — S41112A Laceration without foreign body of left upper arm, initial encounter: Secondary | ICD-10-CM | POA: Insufficient documentation

## 2016-10-18 DIAGNOSIS — Y929 Unspecified place or not applicable: Secondary | ICD-10-CM | POA: Diagnosis not present

## 2016-10-18 DIAGNOSIS — R791 Abnormal coagulation profile: Secondary | ICD-10-CM | POA: Diagnosis not present

## 2016-10-18 DIAGNOSIS — Z23 Encounter for immunization: Secondary | ICD-10-CM | POA: Diagnosis not present

## 2016-10-18 DIAGNOSIS — Y939 Activity, unspecified: Secondary | ICD-10-CM | POA: Insufficient documentation

## 2016-10-18 DIAGNOSIS — I959 Hypotension, unspecified: Secondary | ICD-10-CM | POA: Insufficient documentation

## 2016-10-18 DIAGNOSIS — F17200 Nicotine dependence, unspecified, uncomplicated: Secondary | ICD-10-CM | POA: Diagnosis not present

## 2016-10-18 DIAGNOSIS — S41102A Unspecified open wound of left upper arm, initial encounter: Secondary | ICD-10-CM | POA: Diagnosis present

## 2016-10-18 DIAGNOSIS — T1490XA Injury, unspecified, initial encounter: Secondary | ICD-10-CM

## 2016-10-18 DIAGNOSIS — T148XXA Other injury of unspecified body region, initial encounter: Secondary | ICD-10-CM

## 2016-10-18 DIAGNOSIS — Y999 Unspecified external cause status: Secondary | ICD-10-CM | POA: Insufficient documentation

## 2016-10-18 HISTORY — DX: Essential (primary) hypertension: I10

## 2016-10-18 LAB — URINALYSIS, ROUTINE W REFLEX MICROSCOPIC
BILIRUBIN URINE: NEGATIVE
GLUCOSE, UA: NEGATIVE mg/dL
HGB URINE DIPSTICK: NEGATIVE
Ketones, ur: NEGATIVE mg/dL
Leukocytes, UA: NEGATIVE
NITRITE: NEGATIVE
PH: 6 (ref 5.0–8.0)
Protein, ur: NEGATIVE mg/dL
SPECIFIC GRAVITY, URINE: 1.026 (ref 1.005–1.030)

## 2016-10-18 LAB — I-STAT CHEM 8, ED
BUN: 10 mg/dL (ref 6–20)
CREATININE: 1.5 mg/dL — AB (ref 0.61–1.24)
Calcium, Ion: 0.99 mmol/L — ABNORMAL LOW (ref 1.15–1.40)
Chloride: 105 mmol/L (ref 101–111)
Glucose, Bld: 89 mg/dL (ref 65–99)
HEMATOCRIT: 41 % (ref 39.0–52.0)
HEMOGLOBIN: 13.9 g/dL (ref 13.0–17.0)
POTASSIUM: 3.3 mmol/L — AB (ref 3.5–5.1)
SODIUM: 139 mmol/L (ref 135–145)
TCO2: 21 mmol/L (ref 0–100)

## 2016-10-18 LAB — CBC
HEMATOCRIT: 38.7 % — AB (ref 39.0–52.0)
Hemoglobin: 13.2 g/dL (ref 13.0–17.0)
MCH: 30.8 pg (ref 26.0–34.0)
MCHC: 34.1 g/dL (ref 30.0–36.0)
MCV: 90.4 fL (ref 78.0–100.0)
PLATELETS: 267 10*3/uL (ref 150–400)
RBC: 4.28 MIL/uL (ref 4.22–5.81)
RDW: 13.9 % (ref 11.5–15.5)
WBC: 10.7 10*3/uL — ABNORMAL HIGH (ref 4.0–10.5)

## 2016-10-18 LAB — PROTIME-INR
INR: 1.15
Prothrombin Time: 14.8 seconds (ref 11.4–15.2)

## 2016-10-18 LAB — COMPREHENSIVE METABOLIC PANEL
ALBUMIN: 3.8 g/dL (ref 3.5–5.0)
ALT: 23 U/L (ref 17–63)
AST: 32 U/L (ref 15–41)
Alkaline Phosphatase: 54 U/L (ref 38–126)
Anion gap: 13 (ref 5–15)
BUN: 10 mg/dL (ref 6–20)
CHLORIDE: 106 mmol/L (ref 101–111)
CO2: 19 mmol/L — AB (ref 22–32)
CREATININE: 1.6 mg/dL — AB (ref 0.61–1.24)
Calcium: 8.9 mg/dL (ref 8.9–10.3)
GFR calc Af Amer: >60 mL/min (ref >60)
GFR calc non Af Amer: 52 mL/min — ABNORMAL LOW (ref >60)
GLUCOSE: 91 mg/dL (ref 65–99)
POTASSIUM: 3.6 mmol/L (ref 3.5–5.1)
Sodium: 138 mmol/L (ref 135–145)
Total Bilirubin: 0.7 mg/dL (ref 0.3–1.2)
Total Protein: 6.4 g/dL — ABNORMAL LOW (ref 6.5–8.1)

## 2016-10-18 LAB — SALICYLATE LEVEL

## 2016-10-18 LAB — CDS SEROLOGY

## 2016-10-18 LAB — ACETAMINOPHEN LEVEL

## 2016-10-18 LAB — I-STAT CG4 LACTIC ACID, ED: Lactic Acid, Venous: 3.82 mmol/L (ref 0.5–1.9)

## 2016-10-18 LAB — ETHANOL: Alcohol, Ethyl (B): <5 mg/dL (ref <5)

## 2016-10-18 MED ORDER — LIDOCAINE-EPINEPHRINE 1 %-1:100000 IJ SOLN
20.0000 mL | Freq: Once | INTRAMUSCULAR | Status: AC
  Administered 2016-10-18: 20 mL via INTRADERMAL
  Filled 2016-10-18: qty 1

## 2016-10-18 MED ORDER — TETANUS-DIPHTH-ACELL PERTUSSIS 5-2.5-18.5 LF-MCG/0.5 IM SUSP
0.5000 mL | Freq: Once | INTRAMUSCULAR | Status: AC
  Administered 2016-10-18: 0.5 mL via INTRAMUSCULAR
  Filled 2016-10-18: qty 0.5

## 2016-10-18 MED ORDER — IOPAMIDOL (ISOVUE-370) INJECTION 76%
INTRAVENOUS | Status: AC
  Administered 2016-10-18: 100 mL
  Filled 2016-10-18: qty 100

## 2016-10-18 MED ORDER — SODIUM CHLORIDE 0.9 % IV BOLUS (SEPSIS)
1000.0000 mL | Freq: Once | INTRAVENOUS | Status: AC
  Administered 2016-10-18: 1000 mL via INTRAVENOUS

## 2016-10-18 NOTE — ED Notes (Signed)
Returned from Ct scan  
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
## 2016-10-18 NOTE — ED Notes (Signed)
Able to ambulate in halls no problems
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
## 2017-05-13 ENCOUNTER — Emergency Department (HOSPITAL_COMMUNITY)
Admission: EM | Admit: 2017-05-13 | Discharge: 2017-05-13 | Disposition: A | Discharge status: Home / Self Care | Payer: Medicaid - Out of State | Attending: Emergency Medicine | Admitting: Emergency Medicine

## 2017-05-13 ENCOUNTER — Emergency Department (HOSPITAL_COMMUNITY): Payer: Medicaid - Out of State

## 2017-05-13 ENCOUNTER — Encounter (HOSPITAL_COMMUNITY): Payer: Self-pay

## 2017-05-13 DIAGNOSIS — Y999 Unspecified external cause status: Secondary | ICD-10-CM | POA: Diagnosis not present

## 2017-05-13 DIAGNOSIS — S59901A Unspecified injury of right elbow, initial encounter: Secondary | ICD-10-CM | POA: Diagnosis present

## 2017-05-13 DIAGNOSIS — Y929 Unspecified place or not applicable: Secondary | ICD-10-CM | POA: Diagnosis not present

## 2017-05-13 DIAGNOSIS — W228XXA Striking against or struck by other objects, initial encounter: Secondary | ICD-10-CM | POA: Insufficient documentation

## 2017-05-13 DIAGNOSIS — M25521 Pain in right elbow: Secondary | ICD-10-CM | POA: Diagnosis not present

## 2017-05-13 DIAGNOSIS — F17200 Nicotine dependence, unspecified, uncomplicated: Secondary | ICD-10-CM | POA: Diagnosis not present

## 2017-05-13 DIAGNOSIS — Y939 Activity, unspecified: Secondary | ICD-10-CM | POA: Diagnosis not present

## 2017-05-13 DIAGNOSIS — Z96659 Presence of unspecified artificial knee joint: Secondary | ICD-10-CM | POA: Insufficient documentation

## 2017-05-13 DIAGNOSIS — I10 Essential (primary) hypertension: Secondary | ICD-10-CM | POA: Insufficient documentation

## 2017-05-13 MED ORDER — NAPROXEN 500 MG PO TABS: 500.0000 mg | ORAL_TABLET | Freq: Two times a day (BID) | ORAL | 0 refills | Status: AC

## 2017-05-13 NOTE — ED Provider Notes (Signed)
[facility]PT Provider Note   CSN: [id number] Arrival date & time: 05/13/17  1737  By signing my name below, I, Phillips Climes, attest that this documentation has been prepared under the direction and in the presence of Felicie Morn, NP.  Electronically Signed: Phillips Climes, Scribe. 05/13/2017. 7:36 PM.  History   Chief Complaint Chief Complaint  Patient presents with  . Elbow Injury   HPI Comments Reginald Bautista is a 41 y.o. male with a PMHx significant for HTN, who presents to the Emergency Department with complaints of sudden onset right elbow pain x1 wk, with associated swelling s/p hitting it on a car door. Pain is currently rated a 6/10 in severity. Sx not improving with cold therapy and use of 500 mg ibuprofen qd. Pt denies experiencing any other acute sx, including fever, chills, abdominal pain, nausea, vomiting, numbness or weakness. Sensation intact to fingers.  The history is provided by the patient and medical records. No language interpreter was used.    Past Medical History:  Diagnosis Date  . Chronic knee pain   . Hypertension     There are no active problems to display for this patient.   Past Surgical History:  Procedure Laterality Date  . JOINT REPLACEMENT     recent knee surgery       Home Medications    Prior to Admission medications   Medication Sig Start Date End Date Taking? Authorizing Provider  albuterol (PROVENTIL HFA;VENTOLIN HFA) 108 (90 BASE) MCG/ACT inhaler Inhale 2 puffs into the lungs every 6 (six) hours as needed for wheezing or shortness of breath. 02/27/15   Shaune Pollack, MD  diphenhydrAMINE (SOMINEX) 25 MG tablet Take 25 mg by mouth at bedtime as needed for sleep.    [provider]  HYDROcodone-acetaminophen (NORCO/VICODIN) 5-325 MG per tablet Take 1-2 tablets by mouth every 4 (four) hours as needed (Cough). 02/27/15   Shaune Pollack, MD  ibuprofen (ADVIL,MOTRIN) 400 MG tablet Take 400 mg by mouth every 6 (six) hours  as needed for moderate pain.    [provider]  meclizine (ANTIVERT) 50 MG tablet Take 1 tablet (50 mg total) by mouth 3 (three) times daily as needed. 07/26/14   Rachelle Hora, MD  naproxen (NAPROSYN) 500 MG tablet Take 1 tablet (500 mg total) by mouth 2 (two) times daily. 05/13/17   Felicie Morn, NP    Family History No family history on file.  Social History Social History  Substance Use Topics  . Smoking status: Current Every Day Smoker  . Smokeless tobacco: Not on file  . Alcohol use Yes     Comment: soc     Allergies   Amoxicillin   Review of Systems Review of Systems  Constitutional: Negative for chills and fever.  Gastrointestinal: Negative for abdominal pain, nausea and vomiting.  Musculoskeletal: Positive for arthralgias and joint swelling.  Neurological: Negative for weakness and numbness.  All other systems reviewed and are negative.   Physical Exam Updated Vital Signs BP 138/82   Pulse 82   Temp 98.4 F (36.9 C) (Oral)   Resp 18   SpO2 99%   Physical Exam  Constitutional: He is oriented to person, place, and time. He appears well-developed and well-nourished. No distress.  HENT:  Head: Normocephalic and atraumatic.  Eyes: Conjunctivae are normal.  Neck: Neck supple.  Cardiovascular: Normal rate and regular rhythm.   Pulmonary/Chest: Effort normal and breath sounds normal.  Abdominal: Soft.  Musculoskeletal: Normal range of motion. He exhibits  edema and tenderness. He exhibits no deformity.  Localized swelling to the right elbow. FROM. No strength deficit or injury appreciated. Warm to touch.  Neurological: He is alert and oriented to person, place, and time.  Skin: Skin is warm and dry.  Psychiatric: He has a normal mood and affect.  Nursing note and vitals reviewed.  ED Treatments / Results  DIAGNOSTIC STUDIES: Oxygen Saturation is 99% on RA, normal by my interpretation.    COORDINATION OF CARE: 6:59 PM Discussed treatment plan with pt  at bedside and pt agreed to plan.  Labs (all labs ordered are listed, but only abnormal results are displayed) Labs Reviewed - No data to display  EKG  EKG Interpretation None       Radiology Dg Elbow Complete Right  Result Date: 05/13/2017 CLINICAL DATA:  41 y/o M; 1 week of sudden onset of right elbow pain with associated swelling after hitting a car door. EXAM: RIGHT ELBOW - COMPLETE 3+ VIEW COMPARISON:  None. FINDINGS: There is no evidence of fracture, dislocation, or joint effusion. There is no evidence of arthropathy or other focal bone abnormality. Soft tissue swelling dorsal to the elbow joint and distal humeri with small calcific bodies possibly representing heterotopic ossification. IMPRESSION: 1. No acute fracture, dislocation, or joint effusion. 2. Soft tissue swelling dorsal to the elbow joint and distal humerus with small calcific bodies possibly representing heterotopic ossification. Electronically Signed   By: Mitzi HansenLance  Furusawa-Stratton M.D.   On: 05/13/2017 19:02    Procedures Procedures (including critical care time)  Medications Ordered in ED Medications - No data to display   Initial Impression / Assessment and Plan / ED Course  I have reviewed the triage vital signs and the nursing notes.  Pertinent labs & imaging results that were available during my care of the patient were reviewed by me and considered in my medical decision making (see chart for details).  Patient X-Ray negative for obvious fracture or dislocation.  Pt advised to follow up with orthopedics. Conservative therapy recommended and discussed. Patient will be discharged home & is agreeable with above plan. Returns precautions discussed. Pt appears safe for discharge.       Final Clinical Impressions(s) / ED Diagnoses   Final diagnoses:  Right elbow pain    New Prescriptions New Prescriptions   NAPROXEN (NAPROSYN) 500 MG TABLET    Take 1 tablet (500 mg total) by mouth 2 (two) times daily.     I personally performed the services described in this documentation, which was scribed in my presence. The recorded information has been reviewed and is accurate.    Felicie MornSmith, Breckin Zafar, NP 05/13/17 1946    Jacalyn LefevreHaviland, Julie, MD 05/13/17 [phone]

## 2017-05-13 NOTE — ED Notes (Signed)
Patient called from waiting room. No response.
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
## 2017-05-13 NOTE — ED Notes (Signed)
Patient to xray.

## 2017-05-13 NOTE — ED Notes (Signed)
Pt stable, understands discharge instructions, and reasons for return.   

## 2017-06-20 ENCOUNTER — Emergency Department (HOSPITAL_COMMUNITY): Payer: Medicaid - Out of State

## 2017-06-20 DIAGNOSIS — Y939 Activity, unspecified: Secondary | ICD-10-CM | POA: Insufficient documentation

## 2017-06-20 DIAGNOSIS — I10 Essential (primary) hypertension: Secondary | ICD-10-CM | POA: Insufficient documentation

## 2017-06-20 DIAGNOSIS — S0990XA Unspecified injury of head, initial encounter: Secondary | ICD-10-CM | POA: Insufficient documentation

## 2017-06-20 DIAGNOSIS — S0181XA Laceration without foreign body of other part of head, initial encounter: Secondary | ICD-10-CM | POA: Insufficient documentation

## 2017-06-20 DIAGNOSIS — F17200 Nicotine dependence, unspecified, uncomplicated: Secondary | ICD-10-CM | POA: Insufficient documentation

## 2017-06-20 DIAGNOSIS — Y929 Unspecified place or not applicable: Secondary | ICD-10-CM | POA: Insufficient documentation

## 2017-06-20 DIAGNOSIS — Y998 Other external cause status: Secondary | ICD-10-CM | POA: Insufficient documentation
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
## 2017-06-21 ENCOUNTER — Emergency Department (HOSPITAL_COMMUNITY): Payer: Medicaid - Out of State

## 2017-06-21 ENCOUNTER — Emergency Department (HOSPITAL_COMMUNITY)
Admission: EM | Admit: 2017-06-21 | Discharge: 2017-06-21 | Disposition: A | Discharge status: Home / Self Care | Payer: Medicaid - Out of State | Attending: Emergency Medicine | Admitting: Emergency Medicine

## 2017-06-21 DIAGNOSIS — S0181XA Laceration without foreign body of other part of head, initial encounter: Secondary | ICD-10-CM

## 2017-06-21 MED ORDER — LIDOCAINE-EPINEPHRINE (PF) 2 %-1:200000 IJ SOLN
10.0000 mL | Freq: Once | INTRAMUSCULAR | Status: DC
  Filled 2017-06-21: qty 20

## 2017-06-21 MED ORDER — TETANUS-DIPHTH-ACELL PERTUSSIS 5-2.5-18.5 LF-MCG/0.5 IM SUSP
0.5000 mL | Freq: Once | INTRAMUSCULAR | Status: AC
Start: 2017-06-21 — End: 2017-06-21
  Administered 2017-06-21: 0.5 mL via INTRAMUSCULAR
  Filled 2017-06-21: qty 0.5

## 2017-06-21 NOTE — ED Provider Notes (Signed)
LACERATION REPAIR Performed by: Thermon LeylandHedges,Rahaf Carbonell Todd Authorized by: Thermon LeylandHedges,Terah Robey Todd Consent: Verbal consent obtained. Risks and benefits: risks, benefits and alternatives were discussed Consent given by: patient Patient identity confirmed: provided demographic data Prepped and Draped in normal sterile fashion Wound explored  Laceration Location: forehead  Laceration Length: 3cm  No Foreign Bodies seen or palpated  Anesthesia: local infiltration  Local anesthetic: lidocaine 2 % with epinephrine  Anesthetic total: 2 ml  Irrigation method: syringe Amount of cleaning: standard  Skin closure: simple  Number of sutures: 3  Technique: SI  Patient tolerance: Patient tolerated the procedure well with no immediate complications.   Eyvonne MechanicHedges, Gor Vestal, PA-C 06/21/17 0401    Glynn Octaveancour, Stephen, MD 06/21/17 [phone]

## 2017-06-21 NOTE — Discharge Instructions (Signed)
Follow up in 1 week for suture removal. Return to the ED if you develop new or worsening symptoms. °
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
## 2017-08-28 ENCOUNTER — Emergency Department (HOSPITAL_COMMUNITY): Payer: Medicaid - Out of State

## 2017-08-28 ENCOUNTER — Emergency Department (HOSPITAL_COMMUNITY)
Admission: EM | Admit: 2017-08-28 | Discharge: 2017-08-28 | Disposition: A | Discharge status: Home / Self Care | Payer: Medicaid - Out of State | Attending: Emergency Medicine | Admitting: Emergency Medicine

## 2017-08-28 ENCOUNTER — Encounter (HOSPITAL_COMMUNITY): Payer: Self-pay | Admitting: *Deleted

## 2017-08-28 ENCOUNTER — Other Ambulatory Visit: Payer: Self-pay

## 2017-08-28 DIAGNOSIS — R05 Cough: Secondary | ICD-10-CM

## 2017-08-28 DIAGNOSIS — J069 Acute upper respiratory infection, unspecified: Secondary | ICD-10-CM | POA: Insufficient documentation

## 2017-08-28 DIAGNOSIS — R079 Chest pain, unspecified: Secondary | ICD-10-CM

## 2017-08-28 DIAGNOSIS — R059 Cough, unspecified: Secondary | ICD-10-CM

## 2017-08-28 DIAGNOSIS — Z96659 Presence of unspecified artificial knee joint: Secondary | ICD-10-CM | POA: Insufficient documentation

## 2017-08-28 DIAGNOSIS — R0602 Shortness of breath: Secondary | ICD-10-CM | POA: Insufficient documentation

## 2017-08-28 DIAGNOSIS — I10 Essential (primary) hypertension: Secondary | ICD-10-CM

## 2017-08-28 LAB — BASIC METABOLIC PANEL
Anion gap: 9 (ref 5–15)
BUN: 5 mg/dL — AB (ref 6–20)
CO2: 21 mmol/L — ABNORMAL LOW (ref 22–32)
CREATININE: 0.95 mg/dL (ref 0.61–1.24)
Calcium: 8.7 mg/dL — ABNORMAL LOW (ref 8.9–10.3)
Chloride: 110 mmol/L (ref 101–111)
GFR calc Af Amer: >60 mL/min (ref >60)
Glucose, Bld: 114 mg/dL — ABNORMAL HIGH (ref 65–99)
Potassium: 3.9 mmol/L (ref 3.5–5.1)
Sodium: 140 mmol/L (ref 135–145)

## 2017-08-28 LAB — CBC
HCT: 45.2 % (ref 39.0–52.0)
Hemoglobin: 15 g/dL (ref 13.0–17.0)
MCH: 29.4 pg (ref 26.0–34.0)
MCHC: 33.2 g/dL (ref 30.0–36.0)
MCV: 88.5 fL (ref 78.0–100.0)
Platelets: 334 10*3/uL (ref 150–400)
RBC: 5.11 MIL/uL (ref 4.22–5.81)
RDW: 13.9 % (ref 11.5–15.5)
WBC: 5.2 10*3/uL (ref 4.0–10.5)

## 2017-08-28 LAB — I-STAT TROPONIN, ED
TROPONIN I, POC: 0 ng/mL (ref 0.00–0.08)
Troponin i, poc: 0 ng/mL (ref 0.00–0.08)

## 2017-08-28 MED ORDER — KETOROLAC TROMETHAMINE 30 MG/ML IJ SOLN
30.0000 mg | Freq: Once | INTRAMUSCULAR | Status: AC
  Administered 2017-08-28: 30 mg via INTRAMUSCULAR
  Filled 2017-08-28: qty 1

## 2017-08-28 NOTE — Discharge Instructions (Signed)
Please return to the emergency room if her chest pain is worsened with exercise, associated with sweating, nausea, moves to your left arm/jaw or if you have any concerns.  Please take Ibuprofen (Advil, motrin) and Tylenol (acetaminophen) to relieve your pain.  You may take up to 600 MG (3 pills) of normal strength ibuprofen every 8 hours as needed.  In between doses of ibuprofen you make take tylenol, up to 1,000 mg (two extra strength pills).  Do not take more than 3,000 mg tylenol in a 24 hour period.  Please check all medication labels as many medications such as pain and cold medications may contain tylenol.  Do not drink alcohol while taking these medications.  Do not take other NSAID'S while taking ibuprofen (such as aleve or naproxen).  Please take ibuprofen with food to decrease stomach upset.

## 2017-08-28 NOTE — ED Triage Notes (Signed)
Pt states that he was sitting 2 days ago when he began having left sided chest pain with SOB. Pt states that movement makes it worse.

## 2017-08-28 NOTE — ED Provider Notes (Signed)
[facility] Provider Note   CSN: [id number] Arrival date & time: 08/28/17  1125     History   Chief Complaint Chief Complaint  Patient presents with  . Chest Pain    HPI Reginald Bautista is a 41 y.o. male who states that he was sitting 2 days ago when he began experiencing left-sided chest pain or shortness of breath. His pain is both worsened and recreated by deep breathing. He has not found anything that makes his pain better. He does not have a history of similar pain in the past.  He reports that about 1 week ago he started feeling congested and like he was having a cold.  He has had cough, denies hematemesis, leg swelling, no surgeries or procedures within the past month.  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    HPI  Past Medical History:  Diagnosis Date  . Chronic knee pain   . Hypertension     There are no active problems to display for this patient.   Past Surgical History:  Procedure Laterality Date  . JOINT REPLACEMENT     recent knee surgery       Home Medications    Prior to Admission medications   Medication Sig Start Date End Date Taking? Authorizing Provider  albuterol (PROVENTIL HFA;VENTOLIN HFA) 108 (90 BASE) MCG/ACT inhaler Inhale 2 puffs into the lungs every 6 (six) hours as needed for wheezing or shortness of breath. Patient not taking: Reported on 06/21/2017 02/27/15   Shaune Pollack, MD  HYDROcodone-acetaminophen (NORCO/VICODIN) 5-325 MG per tablet Take 1-2 tablets by mouth every 4 (four) hours as needed (Cough). Patient not taking: Reported on 06/21/2017 02/27/15   Shaune Pollack, MD  meclizine (ANTIVERT) 50 MG tablet Take 1 tablet (50 mg total) by mouth 3 (three) times daily as needed. Patient not taking: Reported on 06/21/2017 07/26/14   Rachelle Hora, MD  naproxen (NAPROSYN) 500 MG  tablet Take 1 tablet (500 mg total) by mouth 2 (two) times daily. Patient not taking: Reported on 06/21/2017 05/13/17   Felicie Morn, NP    Family History No family history on file.  Social History Social History  Substance Use Topics  . Smoking status: Current Every Day Smoker  . Smokeless tobacco: Not on file  . Alcohol use Yes     Comment: soc     Allergies   Amoxicillin   Review of Systems Review of Systems  Constitutional: Negative for chills and fever.  HENT: Negative for ear pain and sore throat.   Eyes: Negative for pain and visual disturbance.  Respiratory: Positive for cough and shortness of breath.   Cardiovascular: Positive for chest pain. Negative for palpitations and leg swelling.  Gastrointestinal: Negative for abdominal pain, nausea and vomiting.  Genitourinary: Negative for dysuria and hematuria.  Musculoskeletal: Negative for arthralgias, back pain and neck pain.  Skin: Negative for color change and rash.  Neurological: Negative for seizures and syncope.  All other systems reviewed and are negative.    Physical Exam Updated Vital Signs BP (!) 158/98   Pulse (!) 57   Temp 97.7 F (36.5 C) (Oral)   Resp 18   Ht  (1.753 m)   Wt 93 kg (205 lb)   SpO2 100%   BMI 30.27 kg/m   Physical Exam  Constitutional:  He appears well-developed and well-nourished. No distress.  HENT:  Head: Normocephalic and atraumatic.  Right Ear: External ear normal.  Left Ear: External ear normal.  Nose: Mucosal edema and rhinorrhea present.  Mouth/Throat: Oropharynx is clear and moist.  Eyes: Conjunctivae are normal. Right eye exhibits no discharge. Left eye exhibits no discharge. No scleral icterus.  Neck: Normal range of motion.  Cardiovascular: Normal rate, regular rhythm, normal heart sounds and intact distal pulses.  Exam reveals no friction rub.   No murmur heard. Pulmonary/Chest: Effort normal and breath sounds normal. No stridor. No respiratory distress. He  has no decreased breath sounds. He has no wheezes. He has no rales. He exhibits tenderness.  Palpation of left chest both re-creates and exacerbates his pain in character, quality, and location.  Deep breathing also re-creates his pain.  Abdominal: Soft. Bowel sounds are normal. He exhibits no distension. There is no tenderness. There is no guarding.  Musculoskeletal: He exhibits no edema or deformity.  Neurological: He is alert. He exhibits normal muscle tone.  Skin: Skin is warm and dry. He is not diaphoretic.  Psychiatric: He has a normal mood and affect. His behavior is normal.  Nursing note and vitals reviewed.    ED Treatments / Results  Labs (all labs ordered are listed, but only abnormal results are displayed) Labs Reviewed  BASIC METABOLIC PANEL - Abnormal; Notable for the following:       Result Value   CO2 21 (*)    Glucose, Bld 114 (*)    BUN 5 (*)    Calcium 8.7 (*)    All other components within normal limits  CBC  I-STAT TROPONIN, ED  I-STAT TROPONIN, ED    EKG  EKG Interpretation  Date/Time:  Thursday August 28 2017 16:12:18 EDT Ventricular Rate:  54 PR Interval:  182 QRS Duration: 76 QT Interval:  418 QTC Calculation: 397 R Axis:   65 Text Interpretation:  Sinus rhythm Anteroseptal infarct, old  similar to previous EKG  Confirmed by Crista Curb [phone]) on 08/28/2017 4:17:24 PM       Radiology Dg Chest 2 View  Result Date: 08/28/2017 CLINICAL DATA:  Left chest pain. Cough and congestion. Shortness of breath. EXAM: CHEST  2 VIEW COMPARISON:  02/27/2015 FINDINGS: The heart size and mediastinal contours are within normal limits. Both lungs are clear. The visualized skeletal structures are unremarkable. IMPRESSION: No active cardiopulmonary disease. Electronically Signed   By: Richarda Overlie M.D.   On: 08/28/2017 11:54    Procedures Procedures (including critical care time)  Medications Ordered in ED Medications  ketorolac (TORADOL) 30 MG/ML injection 30 mg  (30 mg Intramuscular Given 08/28/17 1633)     Initial Impression / Assessment and Plan / ED Course  I have reviewed the triage vital signs and the nursing notes.  Pertinent labs & imaging results that were available during my care of the patient were reviewed by me and considered in my medical decision making (see chart for details).    Heart score 1- Risk factors smoking, HTN Patient is to be discharged with recommendation to follow up with PCP in regards to today's hospital visit. Chest pain is not likely of cardiac or pulmonary etiology d/t presentation, PERC negative, VSS, no tracheal deviation, no JVD or new murmur, RRR, breath sounds equal bilaterally, EKG without acute abnormalities, negative troponin, and negative CXR. Pt has been advised to return to the ED if CP becomes exertional, associated with diaphoresis or nausea, radiates to left jaw/arm,  worsens or becomes concerning in any way. Pt appears reliable for follow up and is agreeable to discharge.   Case has been discussed with and seen by Dr. Verdie MosherLiu who agrees with the above plan to discharge.    Final Clinical Impressions(s) / ED Diagnoses   Final diagnoses:  Chest pain, unspecified type  Upper respiratory tract infection, unspecified type  Cough  Hypertension, unspecified type    New Prescriptions Discharge Medication List as of 08/28/2017  5:15 PM       Cristina GongHammond, Tiara Maultsby W, PA-C 08/28/17 1730    Lavera GuiseLiu, Dana Duo, MD 08/29/17 [phone]

## 2018-06-30 ENCOUNTER — Inpatient Hospital Stay (HOSPITAL_COMMUNITY)
Admission: EM | Admit: 2018-06-30 | Discharge: 2018-07-23 | DRG: 917 | Disposition: E | Payer: Medicaid Other | Attending: Critical Care Medicine | Admitting: Critical Care Medicine

## 2018-06-30 ENCOUNTER — Other Ambulatory Visit: Payer: Self-pay

## 2018-06-30 ENCOUNTER — Inpatient Hospital Stay (HOSPITAL_COMMUNITY): Payer: Medicaid Other

## 2018-06-30 ENCOUNTER — Encounter (HOSPITAL_COMMUNITY): Payer: Self-pay | Admitting: Radiology

## 2018-06-30 ENCOUNTER — Emergency Department (HOSPITAL_COMMUNITY): Payer: Medicaid Other

## 2018-06-30 DIAGNOSIS — G931 Anoxic brain damage, not elsewhere classified: Secondary | ICD-10-CM | POA: Diagnosis present

## 2018-06-30 DIAGNOSIS — Z880 Allergy status to penicillin: Secondary | ICD-10-CM

## 2018-06-30 DIAGNOSIS — T43621A Poisoning by amphetamines, accidental (unintentional), initial encounter: Secondary | ICD-10-CM | POA: Diagnosis present

## 2018-06-30 DIAGNOSIS — F1190 Opioid use, unspecified, uncomplicated: Secondary | ICD-10-CM | POA: Diagnosis present

## 2018-06-30 DIAGNOSIS — R402113 Coma scale, eyes open, never, at hospital admission: Secondary | ICD-10-CM | POA: Diagnosis present

## 2018-06-30 DIAGNOSIS — G9341 Metabolic encephalopathy: Secondary | ICD-10-CM | POA: Diagnosis present

## 2018-06-30 DIAGNOSIS — R402432 Glasgow coma scale score 3-8, at arrival to emergency department: Secondary | ICD-10-CM | POA: Diagnosis present

## 2018-06-30 DIAGNOSIS — F1410 Cocaine abuse, uncomplicated: Secondary | ICD-10-CM | POA: Diagnosis present

## 2018-06-30 DIAGNOSIS — J9601 Acute respiratory failure with hypoxia: Secondary | ICD-10-CM | POA: Diagnosis present

## 2018-06-30 DIAGNOSIS — E872 Acidosis: Secondary | ICD-10-CM | POA: Diagnosis present

## 2018-06-30 DIAGNOSIS — G936 Cerebral edema: Secondary | ICD-10-CM | POA: Diagnosis present

## 2018-06-30 DIAGNOSIS — R402213 Coma scale, best verbal response, none, at hospital admission: Secondary | ICD-10-CM | POA: Diagnosis present

## 2018-06-30 DIAGNOSIS — E875 Hyperkalemia: Secondary | ICD-10-CM | POA: Diagnosis present

## 2018-06-30 DIAGNOSIS — Z96653 Presence of artificial knee joint, bilateral: Secondary | ICD-10-CM | POA: Diagnosis present

## 2018-06-30 DIAGNOSIS — I10 Essential (primary) hypertension: Secondary | ICD-10-CM | POA: Diagnosis present

## 2018-06-30 DIAGNOSIS — G9382 Brain death: Secondary | ICD-10-CM | POA: Diagnosis not present

## 2018-06-30 DIAGNOSIS — J9690 Respiratory failure, unspecified, unspecified whether with hypoxia or hypercapnia: Secondary | ICD-10-CM

## 2018-06-30 DIAGNOSIS — N179 Acute kidney failure, unspecified: Secondary | ICD-10-CM | POA: Diagnosis present

## 2018-06-30 DIAGNOSIS — I469 Cardiac arrest, cause unspecified: Secondary | ICD-10-CM | POA: Diagnosis present

## 2018-06-30 DIAGNOSIS — R570 Cardiogenic shock: Secondary | ICD-10-CM | POA: Diagnosis present

## 2018-06-30 DIAGNOSIS — R402313 Coma scale, best motor response, none, at hospital admission: Secondary | ICD-10-CM | POA: Diagnosis present

## 2018-06-30 DIAGNOSIS — F1910 Other psychoactive substance abuse, uncomplicated: Secondary | ICD-10-CM

## 2018-06-30 DIAGNOSIS — F1590 Other stimulant use, unspecified, uncomplicated: Secondary | ICD-10-CM | POA: Diagnosis present

## 2018-06-30 DIAGNOSIS — E162 Hypoglycemia, unspecified: Secondary | ICD-10-CM | POA: Diagnosis not present

## 2018-06-30 DIAGNOSIS — Z0189 Encounter for other specified special examinations: Secondary | ICD-10-CM

## 2018-06-30 LAB — I-STAT ARTERIAL BLOOD GAS, ED
ACID-BASE DEFICIT: 17 mmol/L — AB (ref 0.0–2.0)
Acid-base deficit: 20 mmol/L — ABNORMAL HIGH (ref 0.0–2.0)
Bicarbonate: 10.2 mmol/L — ABNORMAL LOW (ref 20.0–28.0)
Bicarbonate: 14.2 mmol/L — ABNORMAL LOW (ref 20.0–28.0)
O2 SAT: 91 %
O2 Saturation: 99 %
PCO2 ART: 39.2 mmHg (ref 32.0–48.0)
PCO2 ART: 48.7 mmHg — AB (ref 32.0–48.0)
PH ART: 7.053 — AB (ref 7.350–7.450)
PO2 ART: 188 mmHg — AB (ref 83.0–108.0)
Patient temperature: 34.2
TCO2: 11 mmol/L — ABNORMAL LOW (ref 22–32)
TCO2: 16 mmol/L — AB (ref 22–32)
pH, Arterial: 7.021 — CL (ref 7.350–7.450)
pO2, Arterial: 76 mmHg — ABNORMAL LOW (ref 83.0–108.0)

## 2018-06-30 LAB — COMPREHENSIVE METABOLIC PANEL
ALK PHOS: 61 U/L (ref 38–126)
ALT: 327 U/L — AB (ref 0–44)
AST: 430 U/L — ABNORMAL HIGH (ref 15–41)
Albumin: 3.6 g/dL (ref 3.5–5.0)
Anion gap: 22 — ABNORMAL HIGH (ref 5–15)
BUN: 8 mg/dL (ref 6–20)
CALCIUM: 8.9 mg/dL (ref 8.9–10.3)
CO2: 16 mmol/L — ABNORMAL LOW (ref 22–32)
CREATININE: 2.26 mg/dL — AB (ref 0.61–1.24)
Chloride: 105 mmol/L (ref 98–111)
GFR, EST AFRICAN AMERICAN: 39 mL/min — AB (ref >60)
GFR, EST NON AFRICAN AMERICAN: 34 mL/min — AB (ref >60)
Glucose, Bld: 106 mg/dL — ABNORMAL HIGH (ref 70–99)
Potassium: 5 mmol/L (ref 3.5–5.1)
Sodium: 143 mmol/L (ref 135–145)
Total Bilirubin: 0.7 mg/dL (ref 0.3–1.2)
Total Protein: 6.7 g/dL (ref 6.5–8.1)

## 2018-06-30 LAB — CBC
HCT: 46.7 % (ref 39.0–52.0)
Hemoglobin: 14.1 g/dL (ref 13.0–17.0)
MCH: 31.1 pg (ref 26.0–34.0)
MCHC: 30.2 g/dL (ref 30.0–36.0)
MCV: 102.9 fL — ABNORMAL HIGH (ref 78.0–100.0)
Platelets: 300 10*3/uL (ref 150–400)
RBC: 4.54 MIL/uL (ref 4.22–5.81)
RDW: 13.2 % (ref 11.5–15.5)
WBC: 14.7 10*3/uL — ABNORMAL HIGH (ref 4.0–10.5)

## 2018-06-30 LAB — I-STAT CG4 LACTIC ACID, ED: LACTIC ACID, VENOUS: 14.71 mmol/L — AB (ref 0.5–1.9)

## 2018-06-30 LAB — RAPID URINE DRUG SCREEN, HOSP PERFORMED
Amphetamines: POSITIVE — AB
Benzodiazepines: NOT DETECTED
Cocaine: POSITIVE — AB
Opiates: NOT DETECTED
Tetrahydrocannabinol: POSITIVE — AB

## 2018-06-30 LAB — I-STAT TROPONIN, ED: Troponin i, poc: 0.04 ng/mL (ref 0.00–0.08)

## 2018-06-30 MED ORDER — ACETAMINOPHEN 160 MG/5ML PO SOLN
650.0000 mg | Freq: Four times a day (QID) | ORAL | Status: DC | PRN
  Administered 2018-07-01 – 2018-07-02 (×3): 650 mg
  Filled 2018-06-30 (×3): qty 20.3

## 2018-06-30 MED ORDER — EPINEPHRINE PF 1 MG/ML IJ SOLN: 0.5000 ug/min | INTRAVENOUS | Status: DC

## 2018-06-30 MED ORDER — SODIUM BICARBONATE 8.4 % IV SOLN
INTRAVENOUS | Status: AC
Start: 2018-06-30 — End: 2018-07-01
  Filled 2018-06-30: qty 50

## 2018-06-30 MED ORDER — SODIUM BICARBONATE 8.4 % IV SOLN
INTRAVENOUS | Status: AC | PRN
Start: 2018-06-30 — End: 2018-06-30
  Administered 2018-06-30: 50 meq via INTRAVENOUS

## 2018-06-30 MED ORDER — INSULIN ASPART 100 UNIT/ML ~~LOC~~ SOLN
1.0000 [IU] | SUBCUTANEOUS | Status: DC
  Administered 2018-07-01 – 2018-07-03 (×6): 1 [IU] via SUBCUTANEOUS

## 2018-06-30 MED ORDER — STERILE WATER FOR INJECTION IV SOLN
INTRAVENOUS | Status: DC
  Administered 2018-07-01 – 2018-07-02 (×3): via INTRAVENOUS
  Filled 2018-06-30 (×7): qty 850

## 2018-06-30 MED ORDER — HEPARIN SODIUM (PORCINE) 5000 UNIT/ML IJ SOLN
5000.0000 [IU] | Freq: Three times a day (TID) | INTRAMUSCULAR | Status: DC
  Administered 2018-06-30 – 2018-07-03 (×9): 5000 [IU] via SUBCUTANEOUS
  Filled 2018-06-30 (×8): qty 1

## 2018-06-30 MED ORDER — NOREPINEPHRINE 4 MG/250ML-% IV SOLN
0.0000 ug/min | INTRAVENOUS | Status: DC
  Administered 2018-06-30: 2 ug/min via INTRAVENOUS
  Administered 2018-07-01: 21 ug/min via INTRAVENOUS
  Administered 2018-07-01: 12 ug/min via INTRAVENOUS
  Filled 2018-06-30 (×2): qty 250

## 2018-06-30 MED ORDER — NOREPINEPHRINE 4 MG/250ML-% IV SOLN: 0.0000 ug/min | INTRAVENOUS | Status: DC

## 2018-06-30 MED ORDER — SODIUM BICARBONATE 8.4 % IV SOLN
INTRAVENOUS | Status: AC | PRN
  Administered 2018-06-30: 50 meq via INTRAVENOUS

## 2018-06-30 MED ORDER — FAMOTIDINE 40 MG/5ML PO SUSR
20.0000 mg | Freq: Every day | ORAL | Status: DC
  Administered 2018-07-01 – 2018-07-02 (×3): 20 mg
  Filled 2018-06-30 (×4): qty 2.5

## 2018-06-30 MED ORDER — ACETAMINOPHEN 650 MG RE SUPP: 650.0000 mg | Freq: Four times a day (QID) | RECTAL | Status: DC | PRN

## 2018-06-30 MED ORDER — SODIUM CHLORIDE 0.9 % IV SOLN
250.0000 mL | INTRAVENOUS | Status: DC | PRN
  Administered 2018-07-03: 250 mL via INTRAVENOUS

## 2018-06-30 MED ORDER — MIDAZOLAM HCL 2 MG/2ML IJ SOLN: 2.0000 mg | INTRAMUSCULAR | Status: DC | PRN

## 2018-06-30 MED ORDER — ORAL CARE MOUTH RINSE
15.0000 mL | OROMUCOSAL | Status: DC
  Administered 2018-07-01: 15 mL via OROMUCOSAL

## 2018-06-30 MED ORDER — SODIUM CHLORIDE 0.9 % IV BOLUS
1000.0000 mL | Freq: Once | INTRAVENOUS | Status: AC
Start: 2018-06-30 — End: 2018-06-30
  Administered 2018-06-30: 1000 mL via INTRAVENOUS

## 2018-06-30 MED ORDER — CHLORHEXIDINE GLUCONATE 0.12% ORAL RINSE (MEDLINE KIT)
15.0000 mL | Freq: Two times a day (BID) | OROMUCOSAL | Status: DC
  Administered 2018-07-01 – 2018-07-03 (×4): 15 mL via OROMUCOSAL

## 2018-06-30 MED ORDER — FENTANYL CITRATE (PF) 100 MCG/2ML IJ SOLN: 100.0000 ug | INTRAMUSCULAR | Status: DC | PRN

## 2018-06-30 MED ORDER — EPINEPHRINE PF 1 MG/ML IJ SOLN
0.5000 ug/min | INTRAMUSCULAR | Status: DC
  Administered 2018-06-30: 0.5 ug/min via INTRAVENOUS
  Administered 2018-06-30 – 2018-07-01 (×2): 15 ug/min via INTRAVENOUS
  Filled 2018-06-30 (×2): qty 4

## 2018-06-30 MED ORDER — ONDANSETRON HCL 4 MG/2ML IJ SOLN: 4.0000 mg | Freq: Four times a day (QID) | INTRAMUSCULAR | Status: DC | PRN

## 2018-06-30 MED ORDER — DOCUSATE SODIUM 50 MG/5ML PO LIQD: 100.0000 mg | Freq: Two times a day (BID) | ORAL | Status: DC | PRN

## 2018-06-30 MED ORDER — EPINEPHRINE PF 1 MG/10ML IJ SOSY
PREFILLED_SYRINGE | INTRAMUSCULAR | Status: AC | PRN
  Administered 2018-06-30 (×2): 1 mg via INTRAVENOUS

## 2018-06-30 MED ORDER — SODIUM CHLORIDE 0.9 % IV BOLUS
1000.0000 mL | Freq: Once | INTRAVENOUS | Status: AC
  Administered 2018-06-30: 1000 mL via INTRAVENOUS

## 2018-06-30 MED ORDER — HEPARIN SODIUM (PORCINE) 5000 UNIT/ML IJ SOLN
INTRAMUSCULAR | Status: AC
  Administered 2018-07-01: 5000 [IU] via SUBCUTANEOUS
  Filled 2018-06-30: qty 1

## 2018-06-30 NOTE — H&P (Signed)
PULMONARY / CRITICAL CARE MEDICINE   Name: Reginald Bautista MRN: [id number] DOB: 03/04/1976    ADMISSION DATE:  07/10/2018 CONSULTATION DATE:  7/9  REFERRING MD:  Charma Igo  CHIEF COMPLAINT:  Cardiac arrest  HISTORY OF PRESENT ILLNESS:   42 year old male with past medical history of hypertension and substance abuse.  On 7/90 presented to [facility] emergency department in cardiac arrest.  Prior to this, he was using drugs including methamphetamine, cocaine, heroin, and "just but everything else "with his girlfriend.  She noted him to be diaphoretic just prior to losing consciousness.  Upon arrival of first responders he supposedly had a pulse, however, once EMS arrived he was found to be pulseless in asystole.  CPR was started and continued for approximately 15 to 20 minutes.  Upon arrival to the emergency department he was intubated for airway protection and started on vasopressors for refractory shock. PCCM asked to admit.  His girlfriend is present, but appears intoxicated and is unable to give a history. His family is in [place] and no one has contact information for them.   PAST MEDICAL HISTORY :  He  has no past medical history on file.  PAST SURGICAL HISTORY: He  has no past surgical history on file.  Allergies  Allergen Reactions  . Amoxicillin Other (See Comments)    Has patient had a PCN reaction causing immediate rash, facial/tongue/throat swelling, SOB or lightheadedness with hypotension: Yes Has patient had a PCN reaction causing severe rash involving mucus membranes or skin necrosis: No Has patient had a PCN reaction that required hospitalization: No Has patient had a PCN reaction occurring within the last 10 years: No If all of the above answers are "NO", then may proceed with Cephalosporin use.     No current facility-administered medications on file prior to encounter.    No current outpatient medications on file prior to encounter.    FAMILY HISTORY:  His  family history is not on file.  SOCIAL HISTORY: He    REVIEW OF SYSTEMS:   unable  SUBJECTIVE:    VITAL SIGNS: BP (!) 86/41   Pulse 94   Temp (!) 94.1 F (34.5 C)   Resp (!) 24   SpO2 100%   HEMODYNAMICS:    VENTILATOR SETTINGS: Vent Mode: PRVC FiO2 (%):  [100 %] 100 % Set Rate:  [26 bmp] 26 bmp Vt Set:  [580 mL] 580 mL PEEP:  [5 cmH20] 5 cmH20  INTAKE / OUTPUT: No intake/output data recorded.  PHYSICAL EXAMINATION: General:  Middle aged male on vent Neuro:  Comatose, GCS 3. Pupils 34m and unreactive HEENT:  [place]/AT, no appreciable JVD Cardiovascular:  RRR,  No MRG Lungs:  Clear bilateral breath sounds Abdomen:  Soft, non-tender, non-distended Musculoskeletal:  No acute deformity or ROM limitaiton Skin:  Grossly intact  LABS:  BMET Recent Labs  Lab 07/12/2018 2040  NA 143  K 5.0  CL 105  CO2 16*  BUN 8  CREATININE 2.26*  GLUCOSE 106*    Electrolytes Recent Labs  Lab 06/29/2018 2040  CALCIUM 8.9    CBC Recent Labs  Lab 07/16/2018 2040  WBC 14.7*  HGB 14.1  HCT 46.7  PLT 300    Coag's No results for input(s): APTT, INR in the last 168 hours.  Sepsis Markers Recent Labs  Lab 07/14/2018 2047  LATICACIDVEN 14.71*    ABG Recent Labs  Lab 07/08/2018 2054  PHART 7.021*  PCO2ART 39.2  PO2ART 188.0*    Liver Enzymes Recent  Labs  Lab 07/21/2018 2040  AST 430*  ALT PENDING  ALKPHOS 61  BILITOT 0.7  ALBUMIN 3.6    Cardiac Enzymes No results for input(s): TROPONINI, PROBNP in the last 168 hours.  Glucose No results for input(s): GLUCAP in the last 168 hours.  Imaging Dg Chest Port 1 View  Result Date: 07/20/2018 CLINICAL DATA:  Intubation. EXAM: PORTABLE CHEST 1 VIEW COMPARISON:  None. FINDINGS: Endotracheal tube in place with the tip approximately 3.0 cm above the carina. Enteric tube tip in the distal third of the esophagus. The heart size and mediastinal contours are within normal limits. Normal pulmonary vascularity. No focal  consolidation, pleural effusion, or pneumothorax. No acute osseous abnormality. IMPRESSION: 1. Enteric tube with tip in the distal third of the esophagus. Recommend advancement. 2. Appropriately positioned endotracheal tube. 3.  No active cardiopulmonary disease. Electronically Signed   By: Titus Dubin M.D.   On: 07/05/2018 21:09     STUDIES:  CT head >>>  CULTURES:   ANTIBIOTICS:   SIGNIFICANT EVENTS: Admit 7/9  LINES/TUBES: ETT 7/9 > R fem CVL 7/9 >  DISCUSSION: 43 year old male, drug user. Multiple substance. Suffered cardiac arrest 7/9 after using. CPR for 15 + mins asystole. Unresponsive for up to 15 mins prior to that, Fire reported very week pulse on scene. He is in refractory shock. Neuro exam extremely poor with fixed dilated pupils. negative brainstem reflexes. Admit to ICU for further eval.   ASSESSMENT / PLAN:  PULMONARY A: Inability to protect airway s/p cardiac arrest  P:   Full vent support Follow ABG VAP bundle RR increased to compensate met acidosis.   CARDIOVASCULAR A:  Cardiac arrest  Likely induced by substance abuse Cardiogenic shock  P:  Telemetry MAP goal 65 Epinephrine and norepinephrine infusions Echocardiogram Not a candidate for therapeutic hypothermia.  Follow troponin, lactic  RENAL A:   AKI Severe Anion gap acidosis - lactic  P:   Hydrate, follow BMP Sodium bicarb infusion  GASTROINTESTINAL A:   No acute issues  P:   Pepcid NPO  HEMATOLOGIC A:   No acute issues  P:  Follow CBC Subcutaneous heparin  INFECTIOUS A:   No acute issues  P:   Follow WBC and fever curve  ENDOCRINE A:   No acute issues    P:   Follow glucose on chemistry  NEUROLOGIC A:   Acute anoxic +/- metabolic encephalopathy  P:   RASS goal: 0 No sedation  CT head Concern for severe brain injury/brain death. EEG   FAMILY  - Updates: Girlfriend updated in ED, no family available. No contact information known.   -  Inter-disciplinary family meet or Palliative Care meeting due by:  7/16.   Georgann Housekeeper, AGACNP-BC [facility] Pulmonology/Critical Care Pager [phone] or [phone]  07/02/2018 10:33 PM

## 2018-06-30 NOTE — Progress Notes (Signed)
Tube holder change to hollister. No complications noted

## 2018-06-30 NOTE — Code Documentation (Signed)
Compressions resumed. No pulses noted.

## 2018-06-30 NOTE — ED Triage Notes (Signed)
[facility] found un[facility] [place] the [employer] [place] [address]. Approx downtime 15min. Bystander that seemed to know pt states most likely opiate OD. Noted to have also consumed meth. Given 1mg  narcan by FD with weak pulses. Pt pulseless and in CA upon EMS arrival. Received 5 epi's en route and on scene. Asystole throughout until return of pulses. ETT, NG, and I/O placed by EMS.

## 2018-06-30 NOTE — ED Notes (Signed)
Pt's HR decr'd to <30. No palpable pulses. Resumed compressions and notified MD.

## 2018-06-30 NOTE — Code Documentation (Signed)
Return of pulses- 

## 2018-06-30 NOTE — Progress Notes (Signed)
Patient transported to 2M08 without incidence.

## 2018-06-30 NOTE — ED Provider Notes (Signed)
[facility]   CSN: [id number] Arrival date & time: 06/23/2018  2030     History   Chief Complaint Chief Complaint  Patient presents with  . Drug Overdose  . Cardiac Arrest    HPI Reginald Bautista is a 42 y.o. male.  HPI  The patient is a unknown aged male, presents after a witnessed cardiac arrest.  Evidently the patient had been using methamphetamine, he became very diaphoretic and drank 2 bottles of water, he then fell backwards off of the park when she was sitting on and was found to be in cardiac arrest.  There was a reported history of heroin use as well.  He was given Narcan, this did not help, the downtime was approximately 15 minutes.  In route to the hospital the patient received CPR, he then had 5 rounds of epinephrine, 1 mg boluses through an intraosseous line in the left lower extremity and eventually had return of spontaneous circulation.  The patient did not have any spontaneous respirations, no return of mental status, his rhythm went from what was thought to be asystole back into a sinus rhythm.  Per the paramedics and the police officer whom I spoke with the patient was seen to be doing drugs prior to this happening.  The patient is unresponsive, level 5 caveat applies.  No past medical history on file.  There are no active problems to display for this patient.    The histories are not reviewed yet. Please review them in the "History" navigator section and refresh this SmartLink.      Home Medications    Prior to Admission medications   Not on File    Family History No family history on file.  Social History Social History   Tobacco Use  . Smoking status: Not on file  Substance Use Topics  . Alcohol use: Not on file  . Drug use: Not on file     Allergies   Amoxicillin   Review of Systems Review of Systems  Unable to perform ROS: Intubated     Physical Exam Updated Vital Signs BP (!)  72/33   Pulse 91   Temp (!) 94.3 F (34.6 C)   Resp (!) 26   SpO2 100%   Physical Exam  Constitutional:  Ill-appearing, unresponsive  HENT:  Atraumatic, no hemotympanum, oropharynx without blood  Eyes:  Pupils are dilated, blown and nonreactive  Neck:  Cervical immobilization maintained  Cardiovascular:  The patient's heart sounds are auscultated, distant, palpable pulses are weak centrally  Pulmonary/Chest:  No spontaneous respirations, with assisted ventilation lung sounds can be heard bilaterally  Abdominal:  No masses palpated  Musculoskeletal:  No edema, there is evidence of bilateral knee replacements and an injury to the left antecubital fossa in the past which has been repaired  Neurological:  Obtunded, unresponsive, requiring ventilation  Skin: Skin is warm and dry.     ED Treatments / Results  Labs (all labs ordered are listed, but only abnormal results are displayed) Labs Reviewed  CBC - Abnormal; Notable for the following components:      Result Value   WBC 14.7 (*)    MCV 102.9 (*)    All other components within normal limits  I-STAT CG4 LACTIC ACID, ED - Abnormal; Notable for the following components:   Lactic Acid, Venous 14.71 (*)    All other components within normal limits  I-STAT ARTERIAL BLOOD GAS, ED - Abnormal; Notable for the following  components:   pH, Arterial 7.021 (*)    pO2, Arterial 188.0 (*)    Bicarbonate 10.2 (*)    TCO2 11 (*)    Acid-base deficit 20.0 (*)    All other components within normal limits  COMPREHENSIVE METABOLIC PANEL  RAPID URINE DRUG SCREEN, HOSP PERFORMED  I-STAT TROPONIN, ED  I-STAT CHEM 8, ED    EKG EKG Interpretation  Date/Time:  Tuesday June 30 2018 20:37:12 EDT Ventricular Rate:  118 PR Interval:    QRS Duration: 100 QT Interval:  339 QTC Calculation: 475 R Axis:   70 Text Interpretation:  Sinus tachycardia Repol abnrm, severe global ischemia (LM/MVD) diffuse ST depression Abnormal ekg No old  tracing to compare Confirmed by Eber Hong ([id number]) on 06/29/2018 8:48:16 PM   Radiology Dg Chest Port 1 View  Result Date: 07/14/2018 CLINICAL DATA:  Intubation. EXAM: PORTABLE CHEST 1 VIEW COMPARISON:  None. FINDINGS: Endotracheal tube in place with the tip approximately 3.0 cm above the carina. Enteric tube tip in the distal third of the esophagus. The heart size and mediastinal contours are within normal limits. Normal pulmonary vascularity. No focal consolidation, pleural effusion, or pneumothorax. No acute osseous abnormality. IMPRESSION: 1. Enteric tube with tip in the distal third of the esophagus. Recommend advancement. 2. Appropriately positioned endotracheal tube. 3.  No active cardiopulmonary disease. Electronically Signed   By: Obie Dredge M.D.   On: 07/16/2018 21:09    Procedures .Critical Care Performed by: Eber Hong, MD Authorized by: Eber Hong, MD   Critical care provider statement:    Critical care time (minutes):  35   Critical care time was exclusive of:  Separately billable procedures and treating other patients and teaching time   Critical care was necessary to treat or prevent imminent or life-threatening deterioration of the following conditions:  Cardiac failure   Critical care was time spent personally by me on the following activities:  Blood draw for specimens, development of treatment plan with patient or surrogate, discussions with consultants, evaluation of patient's response to treatment, examination of patient, obtaining history from patient or surrogate, ordering and performing treatments and interventions, ordering and review of laboratory studies, ordering and review of radiographic studies, pulse oximetry, re-evaluation of patient's condition and review of old charts Comments:       .Central Line Date/Time: 06/29/2018 9:23 PM Performed by: Eber Hong, MD Authorized by: Eber Hong, MD   Consent:    Consent obtained:  Emergent  situation Pre-procedure details:    Hand hygiene: Hand hygiene performed prior to insertion     Sterile barrier technique: All elements of maximal sterile technique followed     Skin preparation:  2% chlorhexidine   Skin preparation agent: Skin preparation agent completely dried prior to procedure   Anesthesia (see MAR for exact dosages):    Anesthesia method:  None Procedure details:    Location:  R femoral   Patient position:  Flat   Procedural supplies:  Triple lumen   Catheter size:  7 Fr   Landmarks identified: yes     Ultrasound guidance: yes     Sterile ultrasound techniques: Sterile gel and sterile probe covers were used     Number of attempts:  1   Successful placement: yes   Post-procedure details:    Post-procedure:  Line sutured and dressing applied   Assessment:  Blood return through all ports and free fluid flow   Patient tolerance of procedure:  Tolerated well, no immediate complications Comments:  Cardiopulmonary Resuscitation (CPR) Procedure Note Directed/Performed by: Vida Roller I personally directed ancillary staff and/or performed CPR in an effort to regain return of spontaneous circulation and to maintain cardiac, neuro and systemic perfusion.   (including critical care time)  Medications Ordered in ED Medications  EPINEPHrine (ADRENALIN) 4 mg in dextrose 5 % 250 mL (0.016 mg/mL) infusion (12 mcg/min Intravenous Rate/Dose Change 06/26/2018 2147)  sodium bicarbonate 1 mEq/mL injection (has no administration in time range)  EPINEPHrine (ADRENALIN) 1 MG/10ML injection (1 mg Intravenous Given 06/25/2018 2056)  sodium bicarbonate injection (50 mEq Intravenous Given 07/08/2018 2040)  sodium bicarbonate injection (50 mEq Intravenous Given 06/25/2018 2107)     Initial Impression / Assessment and Plan / ED Course  I have reviewed the triage vital signs and the nursing notes.  Pertinent labs & imaging results that were available during my care of the patient were  reviewed by me and considered in my medical decision making (see chart for details).  Clinical Course as of Jun 30 2149  Tue Jun 30, 2018  2049 Lactic of 14.7   [BM]    Clinical Course User Index [BM] Eber Hong, MD    During the course of the resuscitation the patient lost his pulses and required CPR which I directed and perform myself.  He was intubated successfully prehospital, this was verified on arrival, the rest of his exam is remarkable only for no neurologic function, no spontaneous respirations and requiring intermittent boluses of epinephrine to maintain a pulse.  There is no family here at this time.  The patient is critically ill and if he does make it to the intensive care unit that is where he will need to go.  Will place on epinephrine drip  I have discussed the patient's care with his girlfriend who is the only person here who knows him, evidently he is from [place], there is no other family members here.  Unfortunately the patient's friend told me that he has been using drugs including methamphetamine, cocaine, heroin and just about everything else as well.  He had been pacing around and acting "crazy" today.  She states she should have called 911 earlier but did not, she then starts crying runs out of the room.  I do not have any other family members or information on this patient.  He is currently on an epinephrine drip, his blood pressure is around 80 systolic, I have discussed his care with the intensivist who will evaluate the patient for further and ongoing treatment in the intensive care unit.  Final Clinical Impressions(s) / ED Diagnoses   Final diagnoses:  Cardiac arrest [facility])  Polysubstance abuse [facility])    ED Discharge Orders    None       Eber Hong, MD 06/26/2018 2151

## 2018-06-30 NOTE — Progress Notes (Signed)
Patient transported to CT scan and back to Trauma A without incidence. 

## 2018-06-30 NOTE — ED Notes (Signed)
Patient transported to CT 

## 2018-06-30 NOTE — ED Notes (Signed)
Pt valuables (money, phone, charger, lighter, key card) placed in envelope [phone] and given to Security to place in safe.

## 2018-07-01 ENCOUNTER — Inpatient Hospital Stay (HOSPITAL_COMMUNITY): Payer: Medicaid Other

## 2018-07-01 DIAGNOSIS — I469 Cardiac arrest, cause unspecified: Secondary | ICD-10-CM

## 2018-07-01 LAB — PHOSPHORUS
Phosphorus: 12.1 mg/dL — ABNORMAL HIGH (ref 2.5–4.6)
Phosphorus: 11.1 mg/dL — ABNORMAL HIGH (ref 2.5–4.6)

## 2018-07-01 LAB — BASIC METABOLIC PANEL
ANION GAP: 23 — AB (ref 5–15)
Anion gap: 22 — ABNORMAL HIGH (ref 5–15)
BUN: 18 mg/dL (ref 6–20)
BUN: 12 mg/dL (ref 6–20)
CALCIUM: 6.3 mg/dL — AB (ref 8.9–10.3)
CO2: 16 mmol/L — ABNORMAL LOW (ref 22–32)
CO2: 19 mmol/L — ABNORMAL LOW (ref 22–32)
CREATININE: 2.35 mg/dL — AB (ref 0.61–1.24)
Calcium: 6.4 mg/dL — CL (ref 8.9–10.3)
Chloride: 110 mmol/L (ref 98–111)
Chloride: 102 mmol/L (ref 98–111)
Creatinine, Ser: 2.82 mg/dL — ABNORMAL HIGH (ref 0.61–1.24)
GFR calc Af Amer: 38 mL/min — ABNORMAL LOW (ref >60)
GFR calc Af Amer: 30 mL/min — ABNORMAL LOW (ref >60)
GFR calc non Af Amer: 32 mL/min — ABNORMAL LOW (ref >60)
GFR, EST NON AFRICAN AMERICAN: 26 mL/min — AB (ref >60)
GLUCOSE: 117 mg/dL — AB (ref 70–99)
GLUCOSE: 162 mg/dL — AB (ref 70–99)
POTASSIUM: 6 mmol/L — AB (ref 3.5–5.1)
Potassium: 4.6 mmol/L (ref 3.5–5.1)
SODIUM: 144 mmol/L (ref 135–145)
SODIUM: 148 mmol/L — AB (ref 135–145)

## 2018-07-01 LAB — CBC
HCT: 46.1 % (ref 39.0–52.0)
Hemoglobin: 14.3 g/dL (ref 13.0–17.0)
MCH: 30.6 pg (ref 26.0–34.0)
MCHC: 31 g/dL (ref 30.0–36.0)
MCV: 98.5 fL (ref 78.0–100.0)
Platelets: 341 10*3/uL (ref 150–400)
RBC: 4.68 MIL/uL (ref 4.22–5.81)
RDW: 13.8 % (ref 11.5–15.5)
WBC: 4.9 10*3/uL (ref 4.0–10.5)

## 2018-07-01 LAB — BLOOD GAS, ARTERIAL
ACID-BASE DEFICIT: 0.7 mmol/L (ref 0.0–2.0)
Acid-Base Excess: 1.3 mmol/L (ref 0.0–2.0)
Acid-base deficit: 9.2 mmol/L — ABNORMAL HIGH (ref 0.0–2.0)
BICARBONATE: 17.3 mmol/L — AB (ref 20.0–28.0)
BICARBONATE: 22.9 mmol/L (ref 20.0–28.0)
Bicarbonate: 24.6 mmol/L (ref 20.0–28.0)
DRAWN BY: 350431
DRAWN BY: 350431
FIO2: 100
FIO2: 80
FIO2: 0.6
LHR: 30 {breaths}/min
MECHVT: 580 mL
MECHVT: 580 mL
O2 SAT: 96.6 %
O2 Saturation: 91.7 %
O2 Saturation: 98.1 %
PATIENT TEMPERATURE: 100.9
PATIENT TEMPERATURE: 100.4
PCO2 ART: 36.3 mmHg (ref 32.0–48.0)
PEEP/CPAP: 10 cmH2O
PEEP/CPAP: 10 cmH2O
PEEP: 10 cmH2O
PH ART: 7.422 (ref 7.350–7.450)
PH ART: 7.454 — AB (ref 7.350–7.450)
PO2 ART: 117 mmHg — AB (ref 83.0–108.0)
PO2 ART: 88.4 mmHg (ref 83.0–108.0)
Patient temperature: 97
RATE: 30 resp/min
RATE: 25 resp/min
VT: 580 mL
pCO2 arterial: 44 mmHg (ref 32.0–48.0)
pCO2 arterial: 36 mmHg (ref 32.0–48.0)
pH, Arterial: 7.213 — ABNORMAL LOW (ref 7.350–7.450)
pO2, Arterial: 71.5 mmHg — ABNORMAL LOW (ref 83.0–108.0)

## 2018-07-01 LAB — GLUCOSE, CAPILLARY
GLUCOSE-CAPILLARY: 105 mg/dL — AB (ref 70–99)
GLUCOSE-CAPILLARY: 110 mg/dL — AB (ref 70–99)
GLUCOSE-CAPILLARY: 124 mg/dL — AB (ref 70–99)
GLUCOSE-CAPILLARY: 73 mg/dL (ref 70–99)
GLUCOSE-CAPILLARY: 85 mg/dL (ref 70–99)
GLUCOSE-CAPILLARY: 88 mg/dL (ref 70–99)
Glucose-Capillary: 64 mg/dL — ABNORMAL LOW (ref 70–99)
Glucose-Capillary: 83 mg/dL (ref 70–99)
Glucose-Capillary: 53 mg/dL — ABNORMAL LOW (ref 70–99)

## 2018-07-01 LAB — TROPONIN I
TROPONIN I: 2.65 ng/mL — AB (ref <0.03)
Troponin I: 0.45 ng/mL (ref <0.03)
Troponin I: 12.08 ng/mL (ref <0.03)

## 2018-07-01 LAB — ETHANOL

## 2018-07-01 LAB — CORTISOL: CORTISOL PLASMA: 14 ug/dL

## 2018-07-01 LAB — MAGNESIUM
MAGNESIUM: 2.6 mg/dL — AB (ref 1.7–2.4)
Magnesium: 2.6 mg/dL — ABNORMAL HIGH (ref 1.7–2.4)

## 2018-07-01 LAB — MRSA PCR SCREENING: MRSA by PCR: NEGATIVE

## 2018-07-01 LAB — ECHOCARDIOGRAM COMPLETE: WEIGHTICAEL: 3446.23 [oz_av]

## 2018-07-01 LAB — LACTIC ACID, PLASMA: Lactic Acid, Venous: 12.4 mmol/L (ref 0.5–1.9)

## 2018-07-01 LAB — SALICYLATE LEVEL

## 2018-07-01 LAB — PROCALCITONIN: PROCALCITONIN: 1.73 ng/mL

## 2018-07-01 LAB — HIV ANTIBODY (ROUTINE TESTING W REFLEX): HIV SCREEN 4TH GENERATION: NONREACTIVE

## 2018-07-01 LAB — ACETAMINOPHEN LEVEL: Acetaminophen (Tylenol), Serum: <10 ug/mL — ABNORMAL LOW (ref 10–30)

## 2018-07-01 MED ORDER — SODIUM CHLORIDE 0.9 % IV SOLN
2.0000 g | Freq: Once | INTRAVENOUS | Status: AC
  Administered 2018-07-01: 2 g via INTRAVENOUS
  Filled 2018-07-01: qty 2

## 2018-07-01 MED ORDER — SODIUM CHLORIDE 0.9 % IV SOLN
1.0000 g | Freq: Once | INTRAVENOUS | Status: AC
  Administered 2018-07-01: 1 g via INTRAVENOUS
  Filled 2018-07-01: qty 10

## 2018-07-01 MED ORDER — SODIUM CHLORIDE 0.9 % IV SOLN
2.0000 g | INTRAVENOUS | Status: DC
  Administered 2018-07-02: 2 g via INTRAVENOUS
  Filled 2018-07-01: qty 2

## 2018-07-01 MED ORDER — SODIUM BICARBONATE 8.4 % IV SOLN
INTRAVENOUS | Status: AC
  Administered 2018-07-01: 100 meq
  Filled 2018-07-01: qty 100

## 2018-07-01 MED ORDER — DEXTROSE 50 % IV SOLN: 12.5000 g | Freq: Once | INTRAVENOUS | Status: DC

## 2018-07-01 MED ORDER — DEXTROSE 5 % IV SOLN
INTRAVENOUS | Status: DC
  Administered 2018-07-01: 20:00:00 via INTRAVENOUS

## 2018-07-01 MED ORDER — CHLORHEXIDINE GLUCONATE 0.12% ORAL RINSE (MEDLINE KIT)
15.0000 mL | Freq: Two times a day (BID) | OROMUCOSAL | Status: DC
  Administered 2018-07-02: 15 mL via OROMUCOSAL

## 2018-07-01 MED ORDER — ORAL CARE MOUTH RINSE
15.0000 mL | OROMUCOSAL | Status: DC
  Administered 2018-07-01 – 2018-07-03 (×26): 15 mL via OROMUCOSAL

## 2018-07-01 MED ORDER — LACTATED RINGERS IV BOLUS
500.0000 mL | Freq: Once | INTRAVENOUS | Status: AC
  Administered 2018-07-01: 500 mL via INTRAVENOUS

## 2018-07-01 MED ORDER — NOREPINEPHRINE 16 MG/250ML-% IV SOLN
0.0000 ug/min | INTRAVENOUS | Status: DC
  Administered 2018-07-01: 16 ug/min via INTRAVENOUS
  Filled 2018-07-01: qty 250

## 2018-07-01 MED ORDER — SODIUM POLYSTYRENE SULFONATE 15 GM/60ML PO SUSP
30.0000 g | Freq: Once | ORAL | Status: AC
  Administered 2018-07-01: 30 g
  Filled 2018-07-01: qty 120

## 2018-07-01 MED ORDER — VANCOMYCIN HCL 10 G IV SOLR
1750.0000 mg | Freq: Once | INTRAVENOUS | Status: AC
  Administered 2018-07-01: 1750 mg via INTRAVENOUS
  Filled 2018-07-01: qty 1750

## 2018-07-01 MED ORDER — VANCOMYCIN HCL IN DEXTROSE 1-5 GM/200ML-% IV SOLN
1000.0000 mg | INTRAVENOUS | Status: DC
  Administered 2018-07-02: 1000 mg via INTRAVENOUS
  Filled 2018-07-01: qty 200

## 2018-07-01 MED ORDER — DEXTROSE 50 % IV SOLN
INTRAVENOUS | Status: AC
  Administered 2018-07-01: 50 mL
  Filled 2018-07-01: qty 50

## 2018-07-01 NOTE — Progress Notes (Signed)
CRITICAL VALUE ALERT  Critical Value: Calcium, Trop, Lactic, Phos  Date & Time Notied: 07/01/2018 0300  Provider Notified: Pola CornElink RN  Orders Received/Actions taken: RN will notify MD

## 2018-07-01 NOTE — Progress Notes (Signed)
eLink Physician-Brief Progress Note Patient Name: Reginald AdesWilliam Bautista DOB: [birth year] MRN: [id number]   Date of Service  07/01/2018  HPI/Events of Note  Hypoglycemia ( BS 53 mg %)  eICU Interventions  D50W 1/2 amp iv x 1, D5W iv infusion at 75 ml/hr        [name] [name] 07/01/2018, 7:30 PM

## 2018-07-01 NOTE — Progress Notes (Signed)
PULMONARY / CRITICAL CARE MEDICINE   Name: Reginald Bautista MRN: [id number] DOB: Jan 18, 1976    ADMISSION DATE:  07/09/2018 CONSULTATION DATE:  7/9  REFERRING MD:  Antonieta Loveless  CHIEF COMPLAINT:  Cardiac arrest  HISTORY OF PRESENT ILLNESS:   42 year old male with past medical history of hypertension and substance abuse.  On 7/90 presented to [facility] emergency department in cardiac arrest.  Prior to this, he was using drugs including methamphetamine, cocaine, heroin, and "just but everything else "with his girlfriend.  She noted him to be diaphoretic just prior to losing consciousness.  Upon arrival of first responders he supposedly had a pulse, however, once EMS arrived he was found to be pulseless in asystole.  CPR was started and continued for approximately 15 to 20 minutes.  Upon arrival to the emergency department he was intubated for airway protection and started on vasopressors for refractory shock. PCCM asked to admit.  His girlfriend is present, but appears intoxicated and is unable to give a history. His family is in [place] and no one has contact information for them.     SUBJECTIVE:  42 year old male with polysubstance abuse with cardiac arrest x2 now with pupils fixed and dilated, no gag reflex, negative doll's eyes, on full mechanical ventilatory support not require sedation.  Most likely need further evaluation to establish cerebral blood flow, CT scan of the head consistent with anoxic brain injury.  VITAL SIGNS: BP 106/66   Pulse (!) 123   Temp 100 F (37.8 C)   Resp (!) 30   Wt 215 lb 6.2 oz (97.7 kg)   SpO2 100%   HEMODYNAMICS:    VENTILATOR SETTINGS: Vent Mode: PRVC FiO2 (%):  [80 %-100 %] 80 % Set Rate:  [26 bmp-30 bmp] 30 bmp Vt Set:  [580 mL] 580 mL PEEP:  [5 cmH20-10 cmH20] 10 cmH20 Plateau Pressure:  [25 cmH20-33 cmH20] 33 cmH20  INTAKE / OUTPUT: I/O last 3 completed shifts: In: 7229.7 [I.V.:5173.5; IV Piggyback:2056.3] Out: 360  [Urine:360]  PHYSICAL EXAMINATION: General:   HEENT: MM pink/moist Neuro:  CV: s1s2 rrr, no m/r/g PULM: even/non-labored, lungs bilaterally diminished in bases WU:JWJX, non-tender, bsx4 active  Extremities: warm/dry, neg edema  Skin: no rashes or lesionsenopathy adenopathy is appreciated.  Pupils are fixed at 7 mm Neuro: Negative doll's eyes, negative gag reflex, pupils are fixed and dilated CV: s1s2 rrr, no m/r/g PULM: even/non-labored, lungs bilaterally coarse rhonchi BJ:YNWG, non-tender, bsx4 active  Extremities: warm/dry, negative edema  Skin: no rashes or lesions   LABS:  BMET Recent Labs  Lab 06/29/2018 2040 07/01/18 0045 07/01/18 0431  NA 143 148* 144  K 5.0 6.0* 4.6  CL 105 110 102  CO2 16* 16* 19*  BUN 8 12 18   CREATININE 2.26* 2.35* 2.82*  GLUCOSE 106* 117* 162*    Electrolytes Recent Labs  Lab 07/01/2018 2040 07/01/18 0045 07/01/18 0431  CALCIUM 8.9 6.4* 6.3*  MG  --  2.6* 2.6*  PHOS  --  12.1* 11.1*    CBC Recent Labs  Lab 07/09/2018 2040 07/01/18 0431  WBC 14.7* 4.9  HGB 14.1 14.3  HCT 46.7 46.1  PLT 300 341    Coag's No results for input(s): APTT, INR in the last 168 hours.  Sepsis Markers Recent Labs  Lab 06/25/2018 2047 07/01/18 0045 07/01/18 0047  LATICACIDVEN 14.71*  --  12.4*  PROCALCITON  --  1.73  --     ABG Recent Labs  Lab 07/14/2018 2054 07/22/2018 2324 07/01/18 0500  PHART 7.021* 7.053* 7.213*  PCO2ART 39.2 48.7* 44.0  PO2ART 188.0* 76.0* 71.5*    Liver Enzymes Recent Labs  Lab 06/24/2018 2040  AST 430*  ALT 327*  ALKPHOS 61  BILITOT 0.7  ALBUMIN 3.6    Cardiac Enzymes Recent Labs  Lab 07/01/18 0045 07/01/18 0431  TROPONINI 0.45* 2.65*    Glucose Recent Labs  Lab 07/01/18 0014 07/01/18 0415 07/01/18 0734  GLUCAP 105* 124* 85    Imaging Ct Head Wo Contrast  Result Date: 07/07/2018 CLINICAL DATA:  Post cardiac arrest in CPR. Altered level of consciousness. Found unresponsive. EXAM: CT HEAD WITHOUT  CONTRAST TECHNIQUE: Contiguous axial images were obtained from the base of the skull through the vertex without intravenous contrast. COMPARISON:  None. FINDINGS: Brain: Diffuse sulcal effacement and loss of gray-white differentiation consistent with cerebral edema. Both lateral ventricles are small. The basilar cisterns remain patent. No evidence of infarct, hemorrhage or midline shift. No intracranial fluid collection. Vascular: Increased density of intracranial vasculature diffusely without disproportionate hyperdense vessel. Skull: No fracture or focal lesion. Sinuses/Orbits: Diffuse opacification of the paranasal sinuses, with some hyperdense material in the left maxillary sinus. Nasogastric tube in place, tube partially visualized is coiled in the pharynx. Other: None. IMPRESSION: 1. Diffuse cerebral edema. 2. Paranasal sinus inflammation may be secondary to intubation and nasogastric tubes. Critical Value/emergent results were called by telephone at the time of interpretation on 07/22/2018 at 11:31 pm to Dr. Gwyneth Revelsamachandram , who verbally acknowledged these results. Electronically Signed   By: Rubye OaksMelanie  Ehinger M.D.   On: 07/12/2018 23:33   Dg Chest Port 1 View  Result Date: 07/01/2018 CLINICAL DATA:  Hypoxia EXAM: PORTABLE CHEST 1 VIEW COMPARISON:  June 30, 2018. FINDINGS: Endotracheal tube tip is 3.5 cm above the carina. Nasogastric tube tip and side port are in the stomach. No pneumothorax. There are there in pleural effusions bilaterally with bibasilar atelectasis. There is no frank edema or consolidation. Heart size and pulmonary vascularity are normal. No adenopathy. No bone lesions. IMPRESSION: Tube positions as described without pneumothorax. There are layering effusions bilaterally with bibasilar atelectasis. No consolidation. Stable cardiac silhouette. Electronically Signed   By: Bretta BangWilliam  Woodruff III M.D.   On: 07/01/2018 07:11   Dg Chest Port 1 View  Result Date: 07/05/2018 CLINICAL DATA:   Intubation. EXAM: PORTABLE CHEST 1 VIEW COMPARISON:  None. FINDINGS: Endotracheal tube in place with the tip approximately 3.0 cm above the carina. Enteric tube tip in the distal third of the esophagus. The heart size and mediastinal contours are within normal limits. Normal pulmonary vascularity. No focal consolidation, pleural effusion, or pneumothorax. No acute osseous abnormality. IMPRESSION: 1. Enteric tube with tip in the distal third of the esophagus. Recommend advancement. 2. Appropriately positioned endotracheal tube. 3.  No active cardiopulmonary disease. Electronically Signed   By: Obie DredgeWilliam T Derry M.D.   On: 06/29/2018 21:09   Dg Abd Portable 1v  Result Date: 07/01/2018 CLINICAL DATA:  Orogastric tube placement EXAM: PORTABLE ABDOMEN - 1 VIEW COMPARISON:  Chest x-ray from yesterday FINDINGS: An orogastric tube tip overlaps the stomach. Pre-existing tube seen on prior is still noted over the lower esophagus. Normal bowel gas pattern. Symmetric lung inflation with normal heart size. IMPRESSION: 1. New orogastric tube with tip over the stomach. 2. Pre-existing enteric tube tip remains over the lower esophagus. Electronically Signed   By: Marnee SpringJonathon  Watts M.D.   On: 07/01/2018 01:58     STUDIES:  CT head >>>  CULTURES: 07/01/2018 blood cultures x2>>  07/01/2018 sputum culture>> 07/01/2018 urine culture> 6> ANTIBIOTICS: 07/01/2018 vancomycin 07/01/2018 cefepime>>  SIGNIFICANT EVENTS: Admit 7/9  LINES/TUBES: ETT 7/9 > R fem CVL 7/9 >  DISCUSSION: 42 year old male, drug user. Multiple substance. Suffered cardiac arrest 7/9 after using. CPR for 15 + mins asystole. Unresponsive for up to 15 mins prior to that, Fire reported very week pulse on scene. He is in refractory shock. Neuro exam extremely poor with fixed dilated pupils. negative brainstem reflexes. Admit to ICU for further eval. toxicology positive for amphetamines, cocaine, tetrahydrocannabinol.  ASSESSMENT /  PLAN:  PULMONARY A: Inability to protect airway s/p cardiac arrest  P:   Vent bundle Wean per protocol Increased respiratory rate secondary acidosis No gag reflex Small endotracheal tube #7 in place  CARDIOVASCULAR A:  Cardiac arrest  Likely induced by substance abuse Cardiogenic shock  P:  ICU monitoring Wean Levophed for systolic blood pressure greater than 90 2D echo has been done Currently does not have an arterial line despite multiple attempts to monitor blood pressure cuff therefore will not place a line at this time   RENAL Lab Results  Component Value Date   CREATININE 2.82 (H) 07/01/2018   CREATININE 2.35 (H) 07/01/2018   CREATININE 2.26 (H) July 27, 2018   Recent Labs  Lab 07/27/18 2040 07/01/18 0045 07/01/18 0431  NA 143 148* 144   Recent Labs  Lab July 27, 2018 2040 07/01/18 0045 07/01/18 0431  K 5.0 6.0* 4.6  Chloride 110->102 Calcium 6.4->6.3   A:   AKI Severe Anion gap acidosis -anion gap remains consistent 22-23  P:   Hydration  trend chemistries Bicarbonate drip Replete electrolytes as needed  GASTROINTESTINAL A:   No acute issues  P:   N.p.o. H2-blocker  HEMATOLOGIC Recent Labs    July 27, 2018 2040 07/01/18 0431  HGB 14.1 14.3   No results found for: INR, PROTIME  A:   No acute issues  P:  Trend CBCs DVT prophylaxis  INFECTIOUS A:   Elevated procalcitonin  P:   Start on empirical antibiotics vancomycin and cefepime  ENDOCRINE CBG (last 3)  Recent Labs    07/01/18 0014 07/01/18 0415 07/01/18 0734  GLUCAP 105* 124* 85    A:   No acute issues    P:   Trend glucose  NEUROLOGIC A:   Acute anoxic +/- metabolic encephalopathy  P:   RA SS goal 0 Not currently on any sedation Negative doll's eyes, no gag reflex, pupils are fixed and dilated at 7   FAMILY  - Updates: Girlfriend updated in ED, no family available. No contact information known.  07/01/2018 RN has spoken to mother no family currently at  bedside at time of this dictation.  - Inter-disciplinary family meet or Palliative Care meeting due by:  7/16.   App cct 45 min  Brett Canales Albi Rappaport ACNP Adolph Pollack PCCM Pager [phone] till 1 pm If no answer page [phone] 07/01/2018, 9:04 AM

## 2018-07-01 NOTE — Progress Notes (Signed)
We were called about the head CT on this unfortunate man who suffered cardiac arrest and CPR, unresponsive with GCS 3, fixed and dilated. CT head  Shows diffuse ground glass appearance of brain consistent with diffuse anoxic injury. I do not see anything surgical. Recommend supportive care and if no brainstem reflexes CBF study can be done to rule brain death. Let us know if we can help.

## 2018-07-01 NOTE — Progress Notes (Signed)
Pharmacy Antibiotic Note  Reginald Bautista is a 42 y.o. male admitted on 07/10/2018 with cardiac arrest. Now with concern for sepsis and pharmacy has been consulted for Vancomycin and Cefepime dosing.  The patient is noted to be in AKI - SCr 2.35 (unknown baseline), CrCl~40 ml/min. Hypothermic, LA 12.4, PCT 1.73   Plan: - Vancomycin 1750 mg IV x 1 dose to load followed by Vancomycin 1g IV every 24 hours - Cefepime 2g IV x 1 followed by 2g IV every 24 hours - Will continue to follow renal function, culture results, LOT, and antibiotic de-escalation plans   Weight: 215 lb 6.2 oz (97.7 kg)  Temp (24hrs), Avg:94 F (34.4 C), Min:92.8 F (33.8 C), Max:95.7 F (35.4 C)  Recent Labs  Lab 02/17/2018 2040 02/17/2018 2047 07/01/18 0045 07/01/18 0047  WBC 14.7*  --   --   --   CREATININE 2.26*  --  2.35*  --   LATICACIDVEN  --  14.71*  --  12.4*    CrCl cannot be calculated (Unknown ideal weight.).    Allergies  Allergen Reactions  . Amoxicillin Other (See Comments)    Has patient had a PCN reaction causing immediate rash, facial/tongue/throat swelling, SOB or lightheadedness with hypotension: Yes Has patient had a PCN reaction causing severe rash involving mucus membranes or skin necrosis: No Has patient had a PCN reaction that required hospitalization: No Has patient had a PCN reaction occurring within the last 10 years: No If all of the above answers are "NO", then may proceed with Cephalosporin use.     Antimicrobials this admission: Vanc 7/10 >> Cefepime 7/10 >>  Dose adjustments this admission: n/a  Microbiology results: 7/10 MRSA PCR >> 7/10 BCx >> 7/10 RCx >>  Thank you for allowing pharmacy to be a part of this patient's care.  Georgina PillionElizabeth Nakyiah Kuck, PharmD, BCPS Clinical Pharmacist Pager: [phone] If after 3:30p, please call main pharmacy at: [phone] 07/01/2018 4:48 AM

## 2018-07-01 NOTE — Progress Notes (Signed)
EEG completed, results pending. 

## 2018-07-01 NOTE — Progress Notes (Signed)
Aline Attempted times 4 tries in both arms by Two different RT's and no Line was able to be maintained. Difficult stick with BP being very low and not good perfusion.

## 2018-07-01 NOTE — Progress Notes (Signed)
  Echocardiogram 2D Echocardiogram has been performed.  Delcie RochENNINGTON, Dee Maday 07/01/2018, 9:55 AM

## 2018-07-01 NOTE — Progress Notes (Signed)
Hyperkalemia with K 6.0. Hypocalcemia with Ca 6.4. Will give 1g Calciium gluconate IV as well as 30g Kayexylate PO.   Hyperchloridemia should be improving as pt now on bicarb gtt and no longer receiving any NS boluses.   Procal high at 1.73. Lactate improving slightly but remains high (14.7 to 12.4). Culture pending. Start empiric broad spectrum antibiotics with Vancomycin and Zosyn.   Repeat ABG now to eval if metabolic acidosis improving.

## 2018-07-01 NOTE — Progress Notes (Signed)
PCCM INTERVAL PROGRESS NOTE  Long discussion with patient's mother Clara. She understands that his prognosis is very poor and he may not survive the night despite the maximum medical therapy he is currently receiving. She is obviously upset, but seems to have a good handle of the situation. Unable to decide on code status. Will remain full code.    Joneen RoachPaul Hoffman, AGACNP-BC [facility] Pager [phone] or [phone]  07/01/2018 1:10 AM

## 2018-07-01 NOTE — Procedures (Signed)
ELECTROENCEPHALOGRAM REPORT  Date of Study: 07/01/2018  Patient's Name: Reginald Bautista MRN: [id number] Date of Birth: Nov 03, 1976  Referring Provider: Dr. Lynnell Catalanavi Agarwala  Clinical History: This is a 42 year old man s/p cardiac arrest  Medications: No sedating medications listed  Technical Summary: A multichannel digital EEG recording measured by the international 10-20 system with electrodes applied with paste and impedances below 5000 ohms performed in our laboratory with EKG monitoring in an intubated and unresponsive patient.  Hyperventilation and photic stimulation were not performed.  The digital EEG was referentially recorded, reformatted, and digitally filtered in a variety of bipolar and referential montages for optimal display.    Description: The patient is intubated and unresponsive during the recording. No sedating medications listed. There is loss of normal background activity. The records read at a sensitivity of 3 uV/mm shows diffuse suppression of background activity. There is no spontaneous reactivity or reactivity noted with noxious stimulation. There is electrode artifact at O1 at times. Hyperventilation and photic stimulation were not performed. There were no epileptiform discharges or electrographic seizures seen.   EKG lead showed sinus tachycardia.  Impression: This EEG is markedly abnormal due to diffuse background suppression and lack of EEG reactivity with noxious stimulation.  Clinical Correlation of the above findings indicates severe diffuse cerebral dysfunction that is non-specific in etiology and can be seen in the setting of anoxic/ischemic injury, toxic/metabolic encephalopathies, or medication effect. In the absence of CNS active, sedating, or anesthetic medications, this suggests a poor prognosis. Clinical correlation is advised.   Patrcia DollyKaren Roe Wilner, M.D.

## 2018-07-02 ENCOUNTER — Inpatient Hospital Stay (HOSPITAL_COMMUNITY): Payer: Medicaid Other

## 2018-07-02 LAB — CBC WITH DIFFERENTIAL/PLATELET
BASOS ABS: 0 10*3/uL (ref 0.0–0.1)
Basophils Relative: 0 %
EOS ABS: 0.1 10*3/uL (ref 0.0–0.7)
Eosinophils Relative: 1 %
HCT: 38.7 % — ABNORMAL LOW (ref 39.0–52.0)
Hemoglobin: 13.2 g/dL (ref 13.0–17.0)
LYMPHS PCT: 8 %
Lymphs Abs: 1.1 10*3/uL (ref 0.7–4.0)
MCH: 31.5 pg (ref 26.0–34.0)
MCHC: 34.1 g/dL (ref 30.0–36.0)
MCV: 92.4 fL (ref 78.0–100.0)
Monocytes Absolute: 0.6 10*3/uL (ref 0.1–1.0)
Monocytes Relative: 4 %
NEUTROS ABS: 12 10*3/uL — AB (ref 1.7–7.7)
NEUTROS PCT: 87 %
Platelets: 204 10*3/uL (ref 150–400)
RBC: 4.19 MIL/uL — ABNORMAL LOW (ref 4.22–5.81)
RDW: 13.7 % (ref 11.5–15.5)
WBC MORPHOLOGY: INCREASED
WBC: 13.8 10*3/uL — AB (ref 4.0–10.5)

## 2018-07-02 LAB — POCT I-STAT 3, ART BLOOD GAS (G3+)
Acid-Base Excess: 8 mmol/L — ABNORMAL HIGH (ref 0.0–2.0)
Acid-Base Excess: 8 mmol/L — ABNORMAL HIGH (ref 0.0–2.0)
Acid-Base Excess: 7 mmol/L — ABNORMAL HIGH (ref 0.0–2.0)
Bicarbonate: 30.9 mmol/L — ABNORMAL HIGH (ref 20.0–28.0)
Bicarbonate: 35.4 mmol/L — ABNORMAL HIGH (ref 20.0–28.0)
Bicarbonate: 35.9 mmol/L — ABNORMAL HIGH (ref 20.0–28.0)
O2 Saturation: 100 %
O2 Saturation: 97 %
O2 Saturation: 99 %
PCO2 ART: 35.4 mmHg (ref 32.0–48.0)
PCO2 ART: 62.2 mmHg — AB (ref 32.0–48.0)
PH ART: 7.321 — AB (ref 7.350–7.450)
TCO2: 32 mmol/L (ref 22–32)
TCO2: 37 mmol/L — AB (ref 22–32)
TCO2: 38 mmol/L — ABNORMAL HIGH (ref 22–32)
pCO2 arterial: 69.2 mmHg (ref 32.0–48.0)
pH, Arterial: 7.362 (ref 7.350–7.450)
pH, Arterial: 7.548 — ABNORMAL HIGH (ref 7.350–7.450)
pO2, Arterial: 217 mmHg — ABNORMAL HIGH (ref 83.0–108.0)
pO2, Arterial: 81 mmHg — ABNORMAL LOW (ref 83.0–108.0)
pO2, Arterial: 183 mmHg — ABNORMAL HIGH (ref 83.0–108.0)

## 2018-07-02 LAB — PROCALCITONIN
PROCALCITONIN: 91.56 ng/mL
Procalcitonin: 78.96 ng/mL

## 2018-07-02 LAB — COMPREHENSIVE METABOLIC PANEL
ALBUMIN: 2.2 g/dL — AB (ref 3.5–5.0)
ALK PHOS: 75 U/L (ref 38–126)
ALT: 769 U/L — ABNORMAL HIGH (ref 0–44)
ANION GAP: 13 (ref 5–15)
AST: 1562 U/L — ABNORMAL HIGH (ref 15–41)
BUN: 39 mg/dL — ABNORMAL HIGH (ref 6–20)
CALCIUM: 5.9 mg/dL — AB (ref 8.9–10.3)
CO2: 29 mmol/L (ref 22–32)
Chloride: 96 mmol/L — ABNORMAL LOW (ref 98–111)
Creatinine, Ser: 5.65 mg/dL — ABNORMAL HIGH (ref 0.61–1.24)
GFR calc Af Amer: 13 mL/min — ABNORMAL LOW (ref >60)
GFR calc non Af Amer: 11 mL/min — ABNORMAL LOW (ref >60)
GLUCOSE: 126 mg/dL — AB (ref 70–99)
Potassium: 3.8 mmol/L (ref 3.5–5.1)
SODIUM: 138 mmol/L (ref 135–145)
Total Bilirubin: 1 mg/dL (ref 0.3–1.2)
Total Protein: 4.8 g/dL — ABNORMAL LOW (ref 6.5–8.1)

## 2018-07-02 LAB — BASIC METABOLIC PANEL
ANION GAP: 15 (ref 5–15)
BUN: 35 mg/dL — ABNORMAL HIGH (ref 6–20)
CHLORIDE: 95 mmol/L — AB (ref 98–111)
CO2: 29 mmol/L (ref 22–32)
Calcium: 5.8 mg/dL — CL (ref 8.9–10.3)
Creatinine, Ser: 5.08 mg/dL — ABNORMAL HIGH (ref 0.61–1.24)
GFR calc non Af Amer: 13 mL/min — ABNORMAL LOW (ref >60)
GFR, EST AFRICAN AMERICAN: 15 mL/min — AB (ref >60)
GLUCOSE: 87 mg/dL (ref 70–99)
POTASSIUM: 4.6 mmol/L (ref 3.5–5.1)
Sodium: 139 mmol/L (ref 135–145)

## 2018-07-02 LAB — TROPONIN I: Troponin I: 12.47 ng/mL (ref <0.03)

## 2018-07-02 LAB — PHOSPHORUS: PHOSPHORUS: 2.5 mg/dL (ref 2.5–4.6)

## 2018-07-02 LAB — GLUCOSE, CAPILLARY
GLUCOSE-CAPILLARY: 88 mg/dL (ref 70–99)
GLUCOSE-CAPILLARY: 110 mg/dL — AB (ref 70–99)
GLUCOSE-CAPILLARY: 146 mg/dL — AB (ref 70–99)
GLUCOSE-CAPILLARY: 130 mg/dL — AB (ref 70–99)
Glucose-Capillary: 92 mg/dL (ref 70–99)
Glucose-Capillary: 133 mg/dL — ABNORMAL HIGH (ref 70–99)

## 2018-07-02 LAB — LACTIC ACID, PLASMA: LACTIC ACID, VENOUS: 2.5 mmol/L — AB (ref 0.5–1.9)

## 2018-07-02 LAB — MAGNESIUM: Magnesium: 1.7 mg/dL (ref 1.7–2.4)

## 2018-07-02 MED ORDER — SODIUM CHLORIDE 0.9 % IV SOLN
1.0000 g | INTRAVENOUS | Status: DC
  Administered 2018-07-03: 1 g via INTRAVENOUS
  Filled 2018-07-02 (×2): qty 1

## 2018-07-02 MED ORDER — DEXTROSE 10 % IV SOLN
INTRAVENOUS | Status: DC
  Administered 2018-07-02: 09:00:00 via INTRAVENOUS
  Administered 2018-07-03: 50 mL/h via INTRAVENOUS

## 2018-07-02 MED FILL — Medication: Qty: 1 | Status: AC

## 2018-07-02 NOTE — Progress Notes (Signed)
Baseline ABG results for pre apnea test.     Ref. Range 07/02/2018 13:15  Sample type Unknown ARTERIAL  pH, Arterial Latest Ref Range: 7.350 - 7.450  7.548 (H)  pCO2 arterial Latest Ref Range: 32.0 - 48.0 mmHg 35.4  pO2, Arterial Latest Ref Range: 83.0 - 108.0 mmHg 81.0 (L)  TCO2 Latest Ref Range: 22 - 32 mmol/L 32  Acid-Base Excess Latest Ref Range: 0.0 - 2.0 mmol/L 8.0 (H)  Bicarbonate Latest Ref Range: 20.0 - 28.0 mmol/L 30.9 (H)  O2 Saturation Latest Units: % 97.0  Patient temperature Unknown 98.1 F  Collection site Unknown ARTERIAL LINE

## 2018-07-02 NOTE — Progress Notes (Signed)
Apnea test performed on patient with MD, RT, and RN present in room.  After 8 minutes of being disconnected, results of ABG are as follows.  Results given to Dr. Vassie LollAlva.  Once 8 minutes for apnea test passed, placed patient back on ventilator.  Patient tolerated well.     Ref. Range 07/02/2018 13:33  Sample type Unknown ARTERIAL  pH, Arterial Latest Ref Range: 7.350 - 7.450  7.321 (L)  pCO2 arterial Latest Ref Range: 32.0 - 48.0 mmHg 69.2 (HH)  pO2, Arterial Latest Ref Range: 83.0 - 108.0 mmHg 183.0 (H)  TCO2 Latest Ref Range: 22 - 32 mmol/L 38 (H)  Acid-Base Excess Latest Ref Range: 0.0 - 2.0 mmol/L 7.0 (H)  Bicarbonate Latest Ref Range: 20.0 - 28.0 mmol/L 35.9 (H)  O2 Saturation Latest Units: % 99.0  Patient temperature Unknown 98.1 F  Collection site Unknown ARTERIAL LINE

## 2018-07-02 NOTE — Procedures (Signed)
Arterial Catheter Insertion Procedure Note Reginald Bautista [id number] Jan 29, 1976  Procedure: Insertion of Arterial Catheter  Indications: Blood pressure monitoring and Frequent blood sampling  Procedure Details Consent: Risks of procedure as well as the alternatives and risks of each were explained to the (patient/caregiver).  Consent for procedure obtained. Time Out: Verified patient identification, verified procedure, site/side was marked, verified correct patient position, special equipment/implants available, medications/allergies/relevent history reviewed, required imaging and test results available.  Performed  Maximum sterile technique was used including antiseptics, cap, gloves, gown, hand hygiene, mask and sheet. Skin prep: Chlorhexidine; local anesthetic administered 20 gauge catheter was inserted into right radial artery using the Seldinger technique. ULTRASOUND GUIDANCE USED: YES Evaluation Blood flow good; BP tracing good. Complications: No apparent complications.   Reginald Bautista, Reginald Bautista 07/02/2018

## 2018-07-02 NOTE — Consult Note (Addendum)
NEURO HOSPITALIST CONSULT NOTE   Requestig physician: Dr. Lynetta Mare   Reason for Consult: Prognostication   History obtained from: Chart  HPI:                                                                                                                                          Reginald Bautista is an 42 y.o. male with hypertension polysubstance abuse who apparently was staying in a hotel with his girlfriend and had been using methamphetamine, cocaine, heroin and possibly other illicit substances.  At that point he had a witnessed cardiac arrest.  By report from EMS he was down for approximately 15 minutes on arrival.  There was no bystander CPR.  He then required 25 minutes of CPR prior to ROSC.  This would be about 40 minutes of downtime.  He then had another cardiac arrest in the ED requiring 2 minutes of CPR.  Patient was intubated and placed on sedation in ER.  On PCCM's original exam he had fixed pupils which were dilated, no corneal reflex, no gag, and was not breathing over the vent along with no response to sternal rub.  She was not a candidate for therapeutic hypothermia.  Per chart review on day 2 patient still had no response to any noxious stimuli and pupils fixed and dilated.  Again no response to respiratory effort.  Patient was showing shivering.  CT already showed diffuse loss of gray-white differentiation and early cerebral edema.  EEG was obtained on hospital day 2 and showed diffuse background suppression and lack of EEG reactivity with noxious stimuli.  No epileptiform activity was noted.   Labs on arrival: WBC 14.7 Creatinine 2.26 AST 430 ALT 327 As noted urine drug screen was positive for cocaine, amphetamines, THC  Labs on second day showed: Worsening renal function with creatinine bumped up to 5.08 Calcium dropped to 5.8 GFR dropped to 15   Unfortunately no family is present and girlfriend is trying to track down family.  PMHx: Unable to  assess secondary to patient's altered mental status.   Fhx: Unable to assess secondary to patient's altered mental status.    Social History: Polysubstance abuse  Allergies  Allergen Reactions  . Amoxicillin Other (See Comments)    Has patient had a PCN reaction causing immediate rash, facial/tongue/throat swelling, SOB or lightheadedness with hypotension: Yes Has patient had a PCN reaction causing severe rash involving mucus membranes or skin necrosis: No Has patient had a PCN reaction that required hospitalization: No Has patient had a PCN reaction occurring within the last 10 years: No If all of the above answers are "NO", then may proceed with Cephalosporin use.     MEDICATIONS:  Scheduled: . chlorhexidine gluconate (MEDLINE KIT)  15 mL Mouth Rinse BID  . chlorhexidine gluconate (MEDLINE KIT)  15 mL Mouth Rinse BID  . dextrose  12.5 g Intravenous Once  . famotidine  20 mg Per Tube Q2000  . heparin  5,000 Units Subcutaneous Q8H  . insulin aspart  1-3 Units Subcutaneous Q4H  . mouth rinse  15 mL Mouth Rinse 10 times per day   Continuous: . sodium chloride 10 mL/hr at 07/01/18 0800  . [START ON 2018-07-22] ceFEPime (MAXIPIME) IV    . dextrose 50 mL/hr at 07/02/18 0914   MWU:XLKGMW chloride, acetaminophen (TYLENOL) oral liquid 160 mg/5 mL **OR** acetaminophen, docusate, ondansetron (ZOFRAN) IV   ROS:                                                                                                                                       History obtained from unobtainable from patient due to On the ventilator and nonresponsive    Blood pressure 97/61, pulse (!) 111, temperature (!) 101.3 F (38.5 C), resp. rate (!) 22, weight 97.7 kg (215 lb 6.2 oz), SpO2 97 %.   General Examination:                                                                                                        Physical Exam  HEENT-  Normocephalic, no lesions, without obvious abnormality.  Normal external eye and conjunctiva.   Extremities- Warm, dry and intact Musculoskeletal-no joint tenderness, deformity or swelling Skin-warm and dry, no hyperpigmentation, vitiligo, or suspicious lesions  Neurological Examination Mental Status: Patient does not respond to verbal stimuli.  Does not respond to deep sternal rub.  Does not follow commands.  No verbalizations noted.  Patient is intubated and not breathing over the vent Cranial Nerves: II: patient does not respond confrontation bilaterally,  III,IV,VI: cold calorics without response bilaterally.  pupils right 5 mm, left 5 mm,and nonreactive bilaterally V,VII: corneal reflex absent bilaterally  VIII: patient does not respond to verbal stimuli IX,X: gag reflex absent, XI: trapezius strength unable to test bilaterally XII: tongue strength unable to test Motor: Extremities flaccid throughout.  No spontaneous movement noted.  No purposeful movements noted. Sensory: Does not respond to noxious stimuli in any extremity. Deep Tendon Reflexes:  2+ at the Achilles, none at the knee jerk and 2+ bilaterally upper extremities Plantars: Mute bilaterally Cerebellar: Unable to perform   Lab Results: Basic Metabolic Panel: Recent Labs  Lab 07/17/2018 2040  07/01/18 0045 07/01/18 0431 07/02/18 0437  NA 143 148* 144 139  K 5.0 6.0* 4.6 4.6  CL 105 110 102 95*  CO2 16* 16* 19* 29  GLUCOSE 106* 117* 162* 87  BUN _0 35*  CREATININE 2.26* 2.35* 2.82* 5.08*  CALCIUM 8.9 6.4* 6.3* 5.8*  MG  --  2.6* 2.6* 1.7  PHOS  --  12.1* 11.1* 2.5    CBC: Recent Labs  Lab 06/23/2018 2040 07/01/18 0431  WBC 14.7* 4.9  HGB 14.1 14.3  HCT 46.7 46.1  MCV 102.9* 98.5  PLT 300 341    Cardiac Enzymes: Recent Labs  Lab 07/01/18 0045 07/01/18 0431 07/01/18 0932 07/02/18 0437  TROPONINI 0.45* 2.65* 12.08* 12.47*    Lipid Panel: No  results for input(s): CHOL, TRIG, HDL, CHOLHDL, VLDL, LDLCALC in the last 168 hours.  Imaging: Ct Head Wo Contrast  Result Date: 06/29/2018 CLINICAL DATA:  Post cardiac arrest in CPR. Altered level of consciousness. Found unresponsive. EXAM: CT HEAD WITHOUT CONTRAST TECHNIQUE: Contiguous axial images were obtained from the base of the skull through the vertex without intravenous contrast. COMPARISON:  None. FINDINGS: Brain: Diffuse sulcal effacement and loss of gray-white differentiation consistent with cerebral edema. Both lateral ventricles are small. The basilar cisterns remain patent. No evidence of infarct, hemorrhage or midline shift. No intracranial fluid collection. Vascular: Increased density of intracranial vasculature diffusely without disproportionate hyperdense vessel. Skull: No fracture or focal lesion. Sinuses/Orbits: Diffuse opacification of the paranasal sinuses, with some hyperdense material in the left maxillary sinus. Nasogastric tube in place, tube partially visualized is coiled in the pharynx. Other: None. IMPRESSION: 1. Diffuse cerebral edema. 2. Paranasal sinus inflammation may be secondary to intubation and nasogastric tubes. Critical Value/emergent results were called by telephone at the time of interpretation on 07/02/2018 at 11:31 pm to Dr. Carmie End , who verbally acknowledged these results. Electronically Signed   By: Jeb Levering M.D.   On: 06/28/2018 23:33          Dg Chest Port 1 View  Result Date: 07/02/2018 CLINICAL DATA:  Respiratory failure EXAM: PORTABLE CHEST 1 VIEW COMPARISON:  07/01/2018 FINDINGS: Endotracheal tube remains in good position. NG tube remains in place with the tip not visualized Diffuse bilateral airspace disease with interval improvement. There remains significant bilateral airspace disease left greater than right. Negative for effusion IMPRESSION: Diffuse bilateral airspace disease with interval improvement. Probable pulmonary edema.  Electronically Signed   By: Franchot Gallo M.D.   On: 07/02/2018 07:09   Dg Chest Port 1 View  Result Date: 07/01/2018 CLINICAL DATA:  Hypoxia EXAM: PORTABLE CHEST 1 VIEW COMPARISON:  June 30, 2018. FINDINGS: Endotracheal tube tip is 3.5 cm above the carina. Nasogastric tube tip and side port are in the stomach. No pneumothorax. There are there in pleural effusions bilaterally with bibasilar atelectasis. There is no frank edema or consolidation. Heart size and pulmonary vascularity are normal. No adenopathy. No bone lesions. IMPRESSION: Tube positions as described without pneumothorax. There are layering effusions bilaterally with bibasilar atelectasis. No consolidation. Stable cardiac silhouette. Electronically Signed   By: Lowella Grip III M.D.   On: 07/01/2018 07:11   Dg Chest Port 1 View  Result Date: 07/08/2018 CLINICAL DATA:  Intubation. EXAM: PORTABLE CHEST 1 VIEW COMPARISON:  None. FINDINGS: Endotracheal tube in place with the tip approximately 3.0 cm above the carina. Enteric tube tip in the distal third of the esophagus. The heart size and mediastinal contours are within normal limits. Normal pulmonary  vascularity. No focal consolidation, pleural effusion, or pneumothorax. No acute osseous abnormality. IMPRESSION: 1. Enteric tube with tip in the distal third of the esophagus. Recommend advancement. 2. Appropriately positioned endotracheal tube. 3.  No active cardiopulmonary disease. Electronically Signed   By: Titus Dubin M.D.   On: 07/13/2018 21:09   Dg Abd Portable 1v  Result Date: 07/01/2018 CLINICAL DATA:  Orogastric tube placement EXAM: PORTABLE ABDOMEN - 1 VIEW COMPARISON:  Chest x-ray from yesterday FINDINGS: An orogastric tube tip overlaps the stomach. Pre-existing tube seen on prior is still noted over the lower esophagus. Normal bowel gas pattern. Symmetric lung inflation with normal heart size. IMPRESSION: 1. New orogastric tube with tip over the stomach. 2. Pre-existing  enteric tube tip remains over the lower esophagus. Electronically Signed   By: Monte Fantasia M.D.   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 He meets clinical criteria for brain death if an apena test is consistent with this. Hi sCT showed diffuse injury and EEG with diffuse suppression are consistent with this global injury.   Recommendations: 1) apnea test, if consistent with brain death, then no further testing needed.    Roland Rack, MD [facility] [phone]  If 7pm- 7am, please page neurology on call as listed in [place].

## 2018-07-02 NOTE — Progress Notes (Signed)
Ventilator changes made by Dr. Vassie LollAlva.  Respiratory rate decreased from 25 to 20 and PEEP decreased from 10 to 5.  Patient tolerating well.  Will continue to monitor.

## 2018-07-02 NOTE — Progress Notes (Signed)
Apnea testing performed. He tolerated this hemodynamically. ABG at 5 minutes confirms rising PCO2 and lack of respiration. Brain death was declared at 1:31 PM, mother informed on the phone.  Daughter is on her way over and should be here by tomorrow morning. We will keep him on the respirator until then, DNR issued  Reginald Bautista V. Vassie LollAlva MD

## 2018-07-02 NOTE — Progress Notes (Signed)
CRITICAL VALUE ALERT  Critical Value:  Ca 5.8 and Troponin 12.47  Date & Time Notied:  07/02/18 532  Provider Notified: ELINK  Orders Received/Actions taken: none atm  

## 2018-07-02 NOTE — Progress Notes (Signed)
Pharmacy Antibiotic Note  Reginald Bautista is a 42 y.o. male admitted on 07/07/2018 with cardiac arrest. Now with concern for sepsis and pharmacy has been consulted for Vancomycin and Cefepime dosing.  AKI has worsened with SCr up to 5.08. Remains culture negative. MRSA PCR neg    Plan: - Stopping vancomycin per MD  - Decrease Cefepime to 1 gm IV Q 24 hours  - Will continue to follow renal function, culture results, LOT, and antibiotic de-escalation plans   Weight: 215 lb 6.2 oz (97.7 kg)  Temp (24hrs), Avg:101.3 F (38.5 C), Min:100.4 F (38 C), Max:102.2 F (39 C)  Recent Labs  Lab 2018-09-28 2040 2018-09-28 2047 07/01/18 0045 07/01/18 0047 07/01/18 0431 07/02/18 0437  WBC 14.7*  --   --   --  4.9  --   CREATININE 2.26*  --  2.35*  --  2.82* 5.08*  LATICACIDVEN  --  14.71*  --  12.4*  --  2.5*    CrCl cannot be calculated (Unknown ideal weight.).    Allergies  Allergen Reactions  . Amoxicillin Other (See Comments)    Has patient had a PCN reaction causing immediate rash, facial/tongue/throat swelling, SOB or lightheadedness with hypotension: Yes Has patient had a PCN reaction causing severe rash involving mucus membranes or skin necrosis: No Has patient had a PCN reaction that required hospitalization: No Has patient had a PCN reaction occurring within the last 10 years: No If all of the above answers are "NO", then may proceed with Cephalosporin use.     Antimicrobials this admission: Vanc 7/10 >>7/11 Cefepime 7/10 >>  Dose adjustments this admission: n/a  Microbiology results: 7/10 MRSA PCR >> 7/10 BCx >> 7/10 RCx >>  Thank you for allowing pharmacy to be a part of this patient's care.  Vinnie LevelBenjamin Quan Cybulski, PharmD., BCPS Clinical Pharmacist Clinical phone for 07/02/18 until 3:30pm: [phone] If after 3:30pm, [facility] to [facility] for unit-specific pharmacist

## 2018-07-02 NOTE — Progress Notes (Signed)
42 year old man with a history of polysubstance abuse admitted with cardiac arrest/asystole with prolonged downtime. Head CT showed cerebral edema. EEG showed diffuse cerebral dysfunction, with decreased background activity and no response to noxious stimuli  He has been comatose for 48 hours  on my exam at 9 AM-comatose, GCS 3, no response to deep pain stimulus, pupils dilated and not reactive, absent conjunctival and corneal reflexes, absent gag reflex, doll's eye absent, no spontaneous respiration even on turning respiratory rate down to 6 for a few minutes.  Chest x-ray personally reviewed shows bilateral lower lobe airspace disease Labs show a rising creatinine and troponin of 12.5, normal bicarbonate. 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  Asked neurology to see and they agree with this clinical evaluation.  We will obtain apnea test to confirm  Acute respiratory failure-ventilator settings were reviewed and adjusted.  AKI-discontinue bicarbonate drip. Add D10 for hypoglycemia.  Mother informed about clinical diagnosis of brain death  and that apnea testing is planned.  His daughter is on the way over from [place].  The patient is critically ill with multiple organ systems failure and requires high complexity decision making for assessment and support, frequent evaluation and titration of therapies, application of advanced monitoring technologies and extensive interpretation of multiple databases. Critical Care Time devoted to patient care services described in this note independent of APP/resident  time is 33 minutes.   Comer Locketakesh V. Vassie LollAlva MD

## 2018-07-03 ENCOUNTER — Encounter (HOSPITAL_COMMUNITY): Payer: Self-pay

## 2018-07-03 ENCOUNTER — Other Ambulatory Visit: Payer: Self-pay

## 2018-07-03 LAB — GLUCOSE, CAPILLARY
GLUCOSE-CAPILLARY: 123 mg/dL — AB (ref 70–99)
Glucose-Capillary: 103 mg/dL — ABNORMAL HIGH (ref 70–99)
Glucose-Capillary: 129 mg/dL — ABNORMAL HIGH (ref 70–99)
Glucose-Capillary: 136 mg/dL — ABNORMAL HIGH (ref 70–99)
Glucose-Capillary: 96 mg/dL (ref 70–99)

## 2018-07-04 ENCOUNTER — Encounter (HOSPITAL_COMMUNITY): Payer: Self-pay | Admitting: *Deleted

## 2018-07-06 LAB — CULTURE, BLOOD (ROUTINE X 2)
Culture: NO GROWTH
Culture: NO GROWTH

## 2018-07-23 NOTE — Progress Notes (Signed)
CSW consulted by RN to provide support and assistance to family. CSW met with patient's daughter Naida Sleight at bedside. Daughter had questions about cremation and funeral services; she is from [place] and not familiar with funeral services in the area. CSW provided information on [facility] and coordinating with patient placement. Daughter indicated her grandmother (patient's mother) will arrive in [place] this weekend.   Daughter with appropriate grief response and tearful at times; appreciative of resources.  CSW signing off, as no additional needs identified at this time. Please re-consult as needed.  Estanislado Emms, [place]

## 2018-07-23 NOTE — Procedures (Signed)
Extubation Procedure Note  Patient Details:   Name: Pecolia AdesWilliam Demarco DOB: 1976/01/30 MRN: [id number]   Airway Documentation:    Vent end date: 22-Aug-2018 Vent end time: 1755   Evaluation    Pt extubated per withdrawal of life protocol to Room Air  Carolan ShiverKelley, Colden Samaras M 16-May-2018, 5:57 PM

## 2018-07-23 NOTE — Progress Notes (Signed)
Pt. temperature has been trending down throughout shift. Warm blankets & warming blanket has been implemented with continued decrease in temp. Elink provider notified of change, with no new orders received. RN will continue to monitor.

## 2018-07-23 NOTE — Progress Notes (Signed)
Pt father, Blondell Revealony [place], [place], updated over phone this afternoon. Updated again tonight. Brother, Mellody DanceKeith, also updated. Ms. Tresa ResLavender, pt's mother consulted to ensure appropriateness of giving full disclosure and details of condition and affirms that these individuals and pt's daughter should receive full disclosure.   Pt's father, Alinda Moneyony, wishes to speak with doctor in the AM for affirmation of condition and criteria used in determination. His number is [phone].

## 2018-07-23 NOTE — Progress Notes (Signed)
His daughter Denny Peonvery arrived from [place] -I explained and discussed the medical circumstances leading to the diagnosis of brain death.  I explained the concept of brain death. This concept was also discussed in detail with multiple times with the patient's mother Lewanda RifeClara and with his father Alinda Moneyony.  They requested that we keep him on the respirator until Sunday to give the family more time to arrive from [place].  I discussed this with nursing leadership. While we understand that this is a very difficult & tragic time for this family and we want to be considered and respectful of their admissions, I  explained to them that we would have to discontinue life support   Shannen Vernon V. Vassie LollAlva MD

## 2018-07-23 NOTE — Progress Notes (Signed)
Responded to two pages for this family and patient.  Accompanied MD in to once again notify the family regarding it being time to take him off of the vent because he has already passed away (brain death).  Before going in the MD informed me of the situation.  Daughter was very tearful and wanted to talk to the grandmother on the phone.  We exited the room and gave her some space to do so.  The second page was to notify me of the time of death which happened right after extubation.  Spoke to the male cousin in the room who said this patient lived with him sometimes.  Offered prayer, the cousin said he had been praying and that he appreciated the support.  Grief and emotional support provided and compassionate presence.    07/13/2018 1900  Clinical Encounter Type  Visited With Family;Patient and family together;Health care provider  Visit Type Follow-up;Spiritual support;Death  Spiritual Encounters  Spiritual Needs Emotional;Grief support

## 2018-07-23 NOTE — Progress Notes (Signed)
Nutrition Brief Note  Chart reviewed due to MST score of 2 and ventilator status. Patient has a very poor prognosis. Plans to extubate and transition to comfort care.  No nutrition interventions warranted at this time.  Please consult as needed.   Joaquin CourtsKimberly Dorsey Charette, RD, LDN, CNSC Pager [phone] After Hours Pager [phone]

## 2018-07-23 NOTE — Progress Notes (Addendum)
Patient extubated, per order, and immediately apneic at 1755 hrs. Patient is pulseless at 1758 hrs. Cardiac TOD 18:14 hrs today; confirmed by this RN and Cherly HensenPatrick Dickinson, RN.  CDS notified. Contact: April Shore. Reference number [id number]

## 2018-07-23 NOTE — Plan of Care (Signed)
  Problem: Education: Goal: Knowledge of General Education information will improve Outcome: Not Progressing   Problem: Health Behavior/Discharge Planning: Goal: Ability to manage health-related needs will improve Outcome: Not Progressing   Problem: Clinical Measurements: Goal: Ability to maintain clinical measurements within normal limits will improve Outcome: Not Progressing Goal: Will remain free from infection Outcome: Not Progressing Goal: Diagnostic test results will improve Outcome: Not Progressing Goal: Respiratory complications will improve Outcome: Not Progressing Goal: Cardiovascular complication will be avoided Outcome: Not Progressing   Problem: Activity: Goal: Risk for activity intolerance will decrease Outcome: Not Progressing   Problem: Nutrition: Goal: Adequate nutrition will be maintained Outcome: Not Progressing   Problem: Coping: Goal: Level of anxiety will decrease Outcome: Not Progressing   Problem: Elimination: Goal: Will not experience complications related to bowel motility Outcome: Not Progressing Goal: Will not experience complications related to urinary retention Outcome: Not Progressing   Problem: Pain Managment: Goal: General experience of comfort will improve Outcome: Not Progressing   Problem: Safety: Goal: Ability to remain free from injury will improve Outcome: Not Progressing   Problem: Skin Integrity: Goal: Risk for impaired skin integrity will decrease Outcome: Not Progressing   

## 2018-07-23 NOTE — Discharge Summary (Signed)
42 year old man with a history of polysubstance abuse admitted with cardiac arrest/asystole with prolonged downtime. Head CT showed cerebral edema. EEG showed diffuse cerebral dysfunction, with decreased background activity and no response to noxious stimuli    COURSE -  Anoxic encephalopathy-unfortunately he remained comatose and his exam was consistent with brain death.  Asked neurology to see and they agreed with this clinical evaluation.  Apnea test confirmed the diagnosis of brain death  His mother and father were informed.  Daughter Denny Peonvery made it over from [place] & we waited  for her arrival before withdrawal of life support.  Cause of death -anoxic encephalopathy due to cerebral edema post cardiac arrest  [name] MD. FCCP. [facility] Pager [phone] If no response call [phone]   07/06/2018  Reginald Locketakesh V. Vassie LollAlva MD

## 2018-07-23 NOTE — Progress Notes (Signed)
   15-Dec-2018 1700  Clinical Encounter Type  Visited With Family;Patient and family together  Visit Type Initial  Referral From Nurse  Consult/Referral To Chaplain  Spiritual Encounters  Spiritual Needs Emotional    Pt was intubated and seemingly very sick. Doctor had a talk with pt's daughter Denny Peonvery who was very tearful Pt;s daughter reguested for withdrawal to wait till Sunday when other family members arrive. Doctor discussed with nursing director. Chaplain provided emotional support through reflective empathic listening, compassionate presence and prayer. Daughter and family will need more spiritual and emotional support.  Reginald Bautista a Water quality scientistMusiko-Holley, E. I. du PontChaplain

## 2018-07-23 DEATH — deceased

## 2019-01-01 IMAGING — DX DG CHEST 1V PORT
1 series · 1 of 1 positions shown · non-contrast
Comparison: June 30, 2018.

CLINICAL DATA: Hypoxia

EXAM:
PORTABLE CHEST 1 VIEW

[chest]
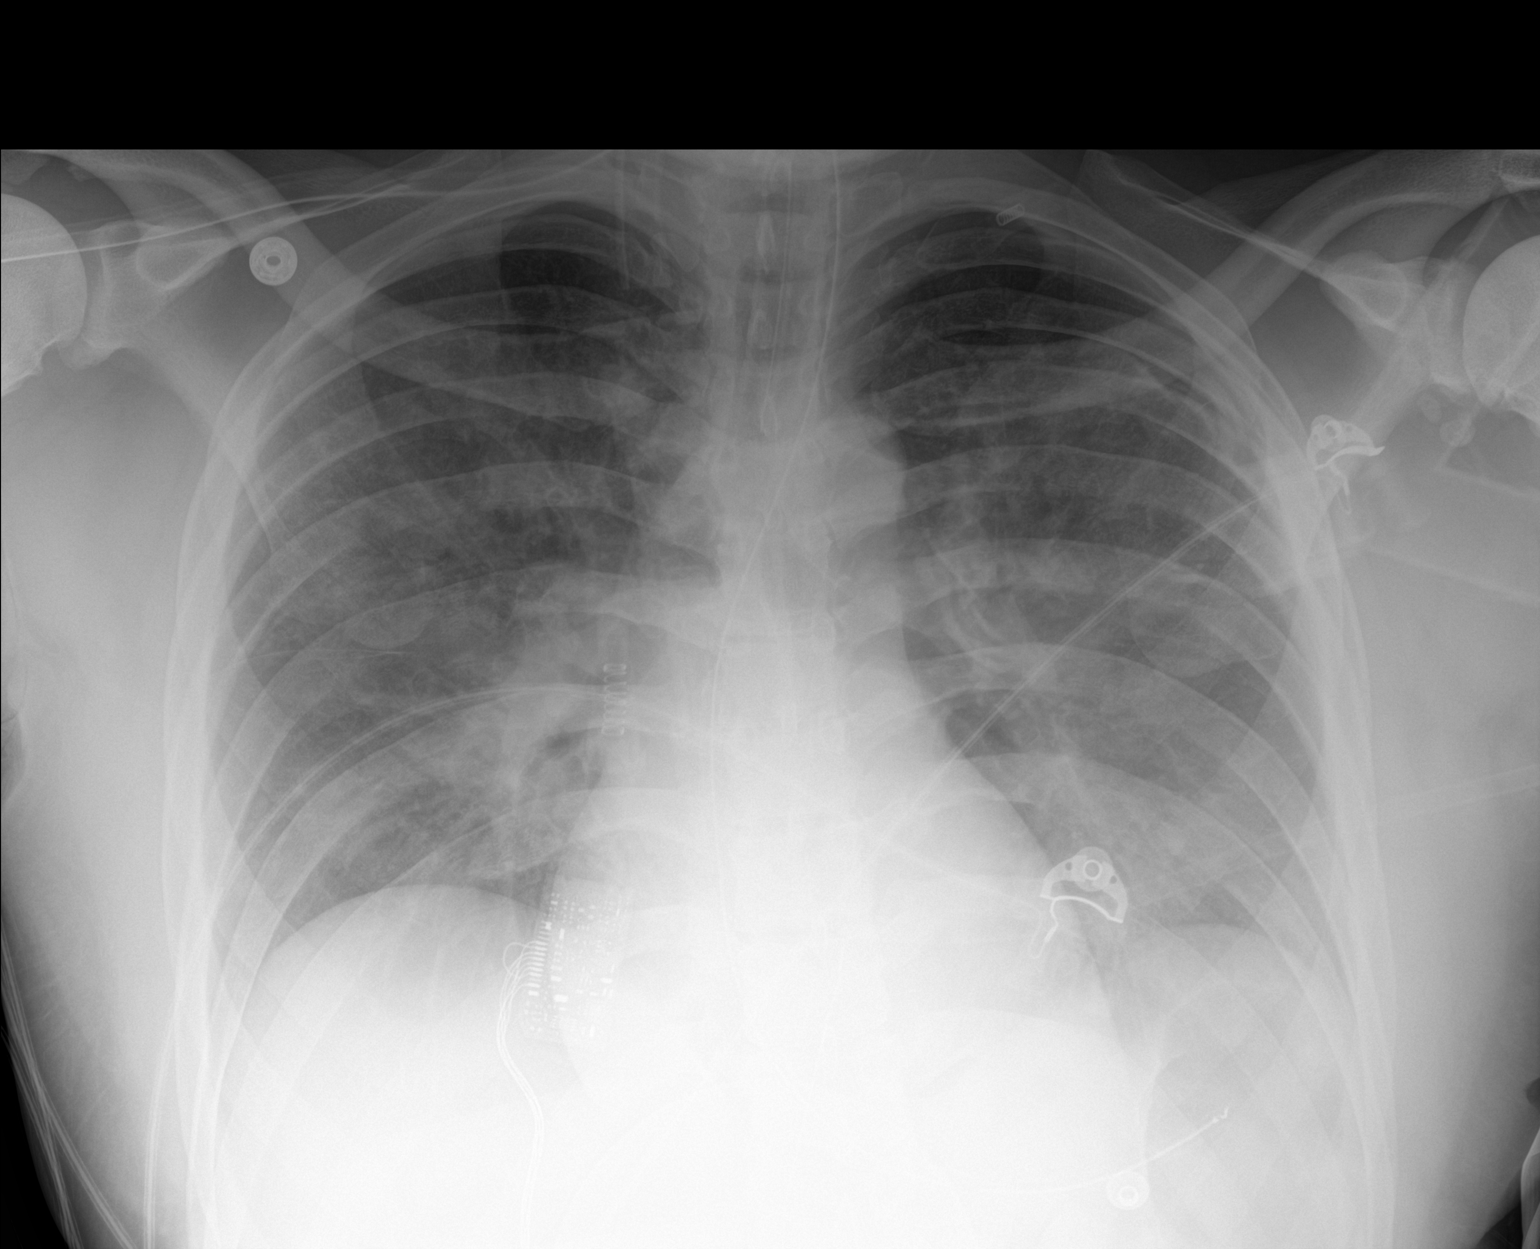

[1 of 1 positions shown; findings below may reference images not displayed]

FINDINGS: Endotracheal tube tip is 3.5 cm above the carina. Nasogastric tube
tip and side port are in the stomach. No pneumothorax. There are
there in pleural effusions bilaterally with bibasilar atelectasis.
There is no frank edema or consolidation. Heart size and pulmonary
vascularity are normal. No adenopathy. No bone lesions.
IMPRESSION: Tube positions as described without pneumothorax. There are layering
effusions bilaterally with bibasilar atelectasis. No consolidation.
Stable cardiac silhouette.

## 2019-01-10 IMAGING — CT CT HEAD W/O CM
3 series · 15 of 47 positions shown, 18 images · non-contrast
Comparison: None.

CLINICAL DATA: Head injury with laceration to right forehead.

EXAM:
CT HEAD WITHOUT CONTRAST
TECHNIQUE: Contiguous axial images were obtained from the base of the skull
through the vertex without intravenous contrast.

[Series 3: head 5.0 h30s · axial · 0.50mm/px · z∈[-194,-59]mm · 9 of 33 slices shown, 12 images]
[im 3/33  brain]
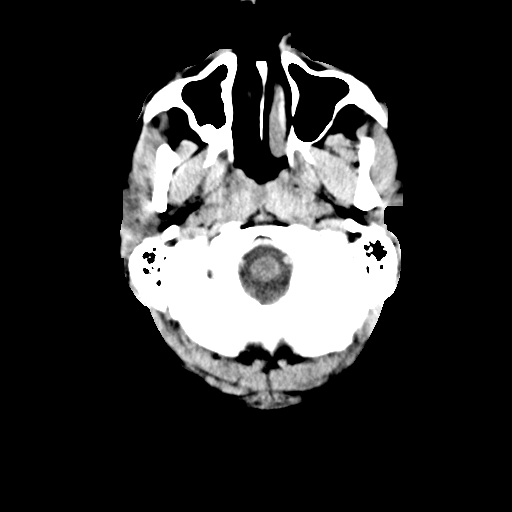
[im 3/33  bone]
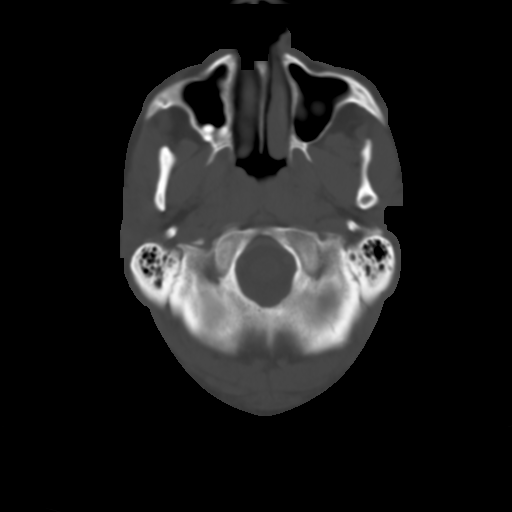
[im 6/33  brain]
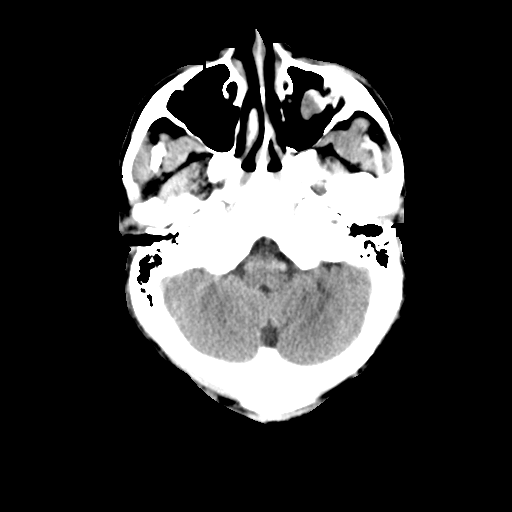
[im 9/33  brain]
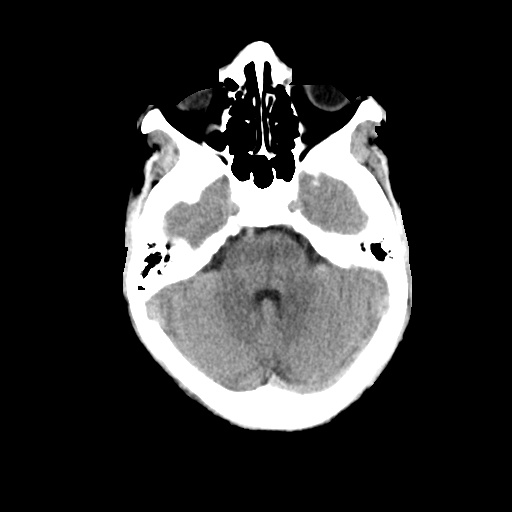
[im 13/33  brain]
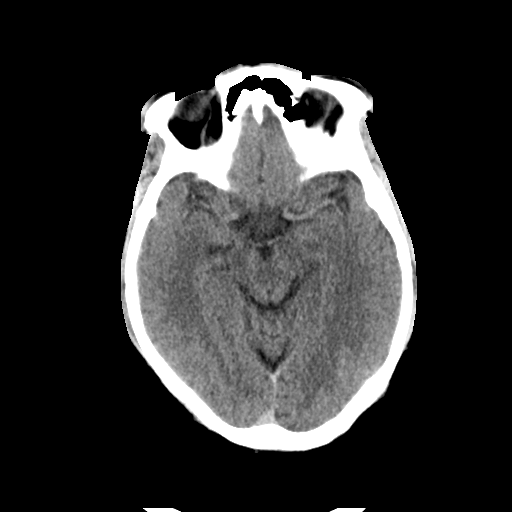
[im 17/33  brain]
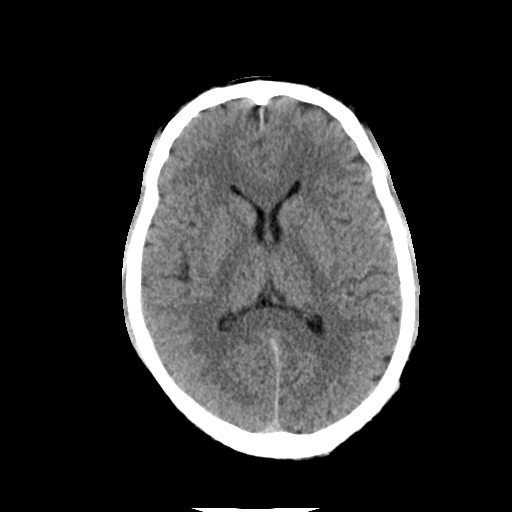
[im 17/33  bone]
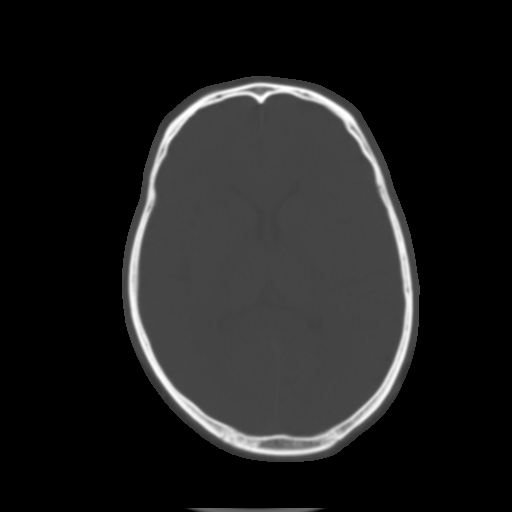
[im 20/33  brain]
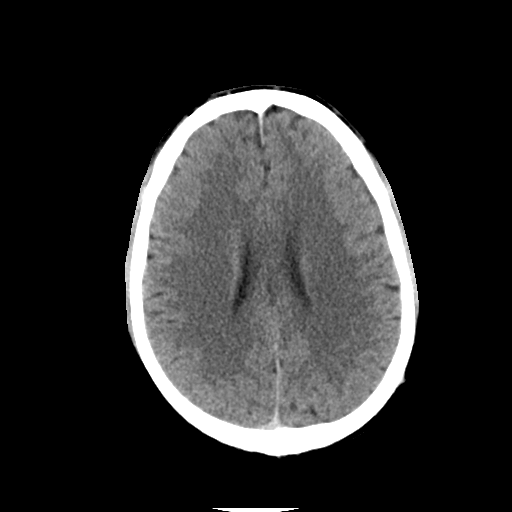
[im 24/33  brain]
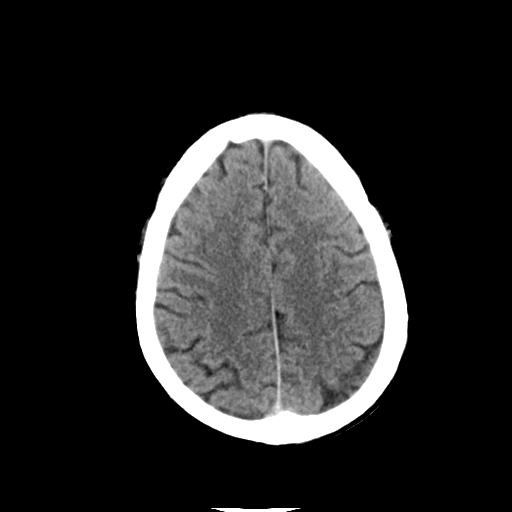
[im 27/33  brain]
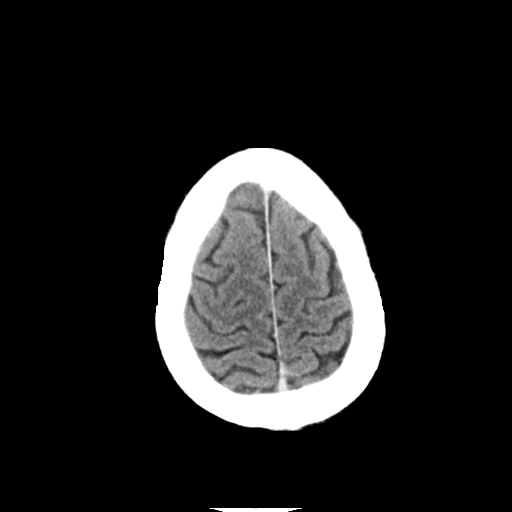
[im 30/33  brain]
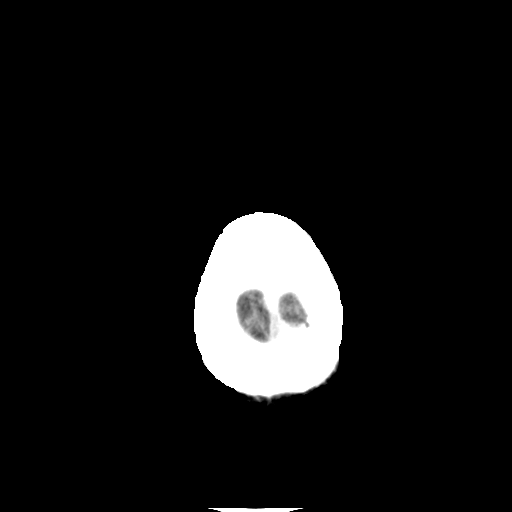
[im 30/33  bone]
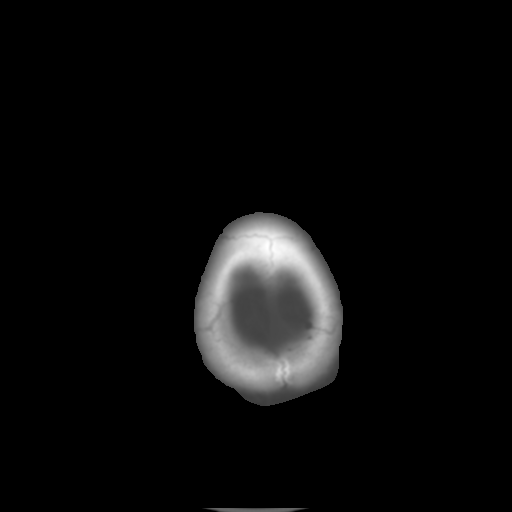

[Series 5: head 3.0 mpr cor · coronal · 0.32mm/px · 3 of 70 slices shown]
[im 24/70  brain]
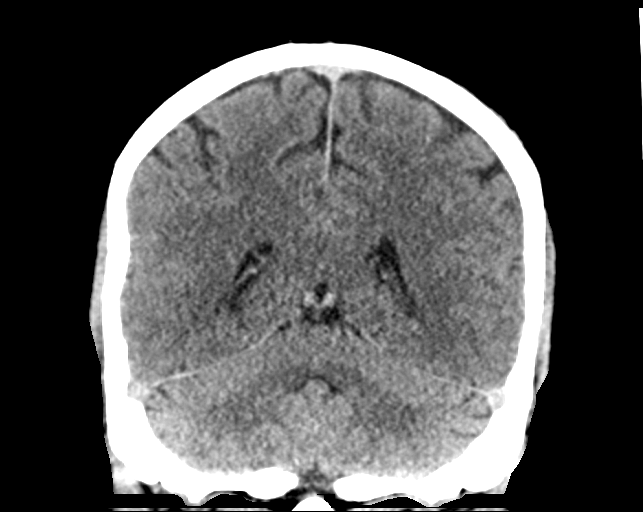
[im 31/70  brain]
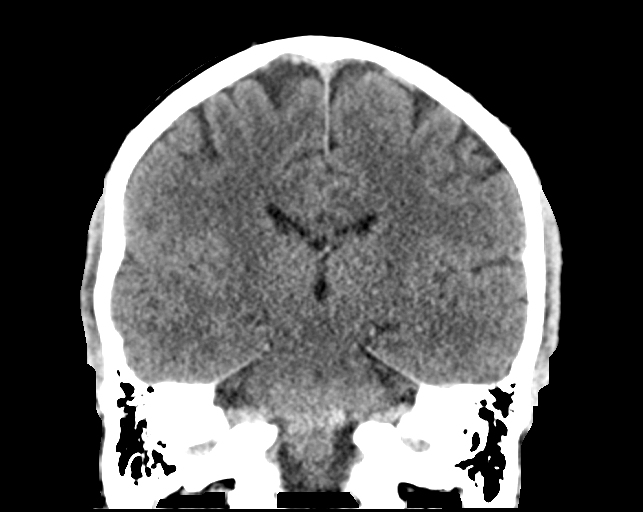
[im 39/70  brain]
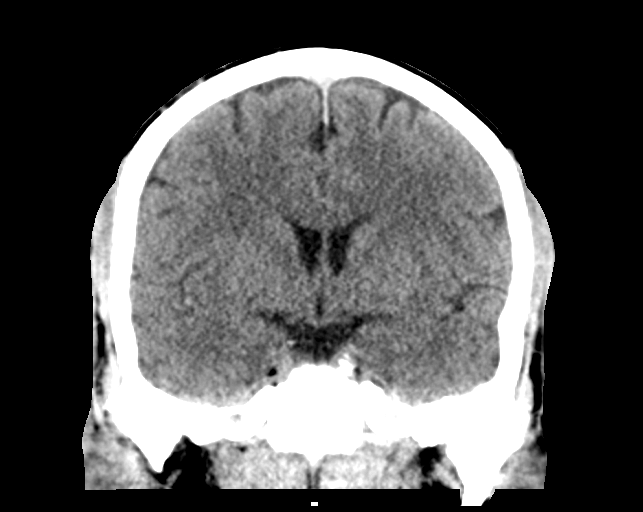

[Series 6: head 3.0 mpr sag · sagittal · 0.32mm/px · 3 of 58 slices shown]
[im 20/58  brain]
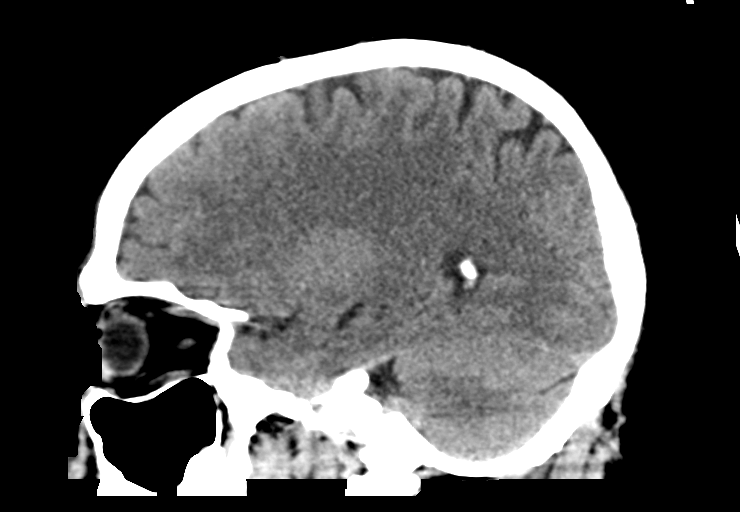
[im 29/58  brain]
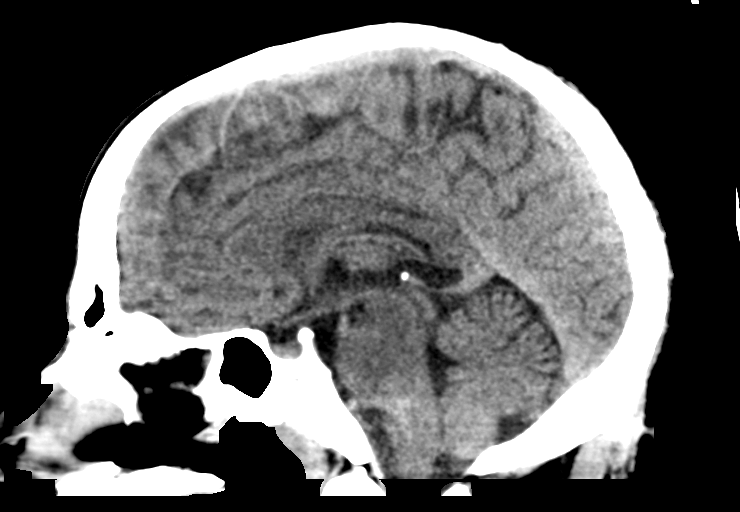
[im 39/58  brain]
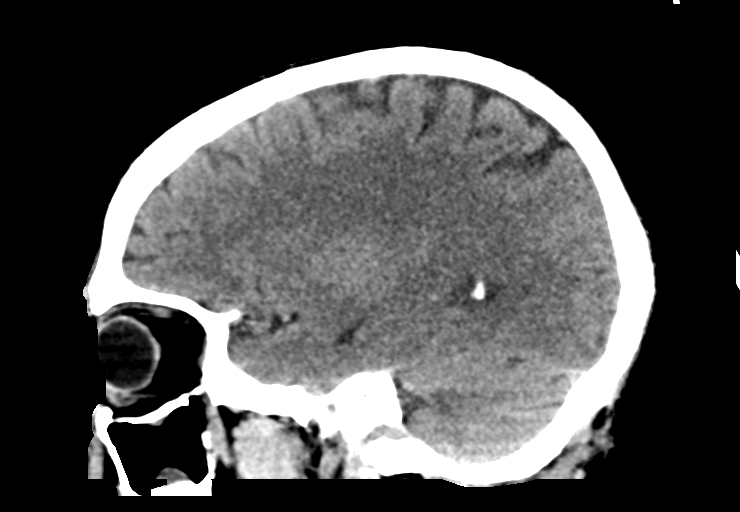

[15 of 47 positions shown; findings below may reference images not displayed]

FINDINGS: Brain: No evidence of acute infarction, hemorrhage, hydrocephalus,
extra-axial collection or mass lesion/mass effect.

Vascular: No hyperdense vessel or unexpected calcification.

Skull: Small right frontal scalp laceration. No displaced calvarial
fracture.

Sinuses/Orbits: Small right and moderate-sized left maxillary sinus
mucous retention cyst. Otherwise visualized paranasal sinuses and
mastoid air cells are normally aerated. Orbits are unremarkable.

Other: None.
IMPRESSION: 1. Right frontal scalp laceration.  No displaced calvarial fracture.
2. No acute intracranial abnormality.
3. Left-greater-than-right maxillary sinus disease with mucous
retention cysts.
4. Otherwise unremarkable CT of the head.

By: Sussy Dahlgren M.D.
